# Patient Record
Sex: Female | Born: 2000 | Race: Black or African American | Hispanic: No | Marital: Single | Smoking: Never smoker
Health system: Southern US, Community
[De-identification: ages and names within clinical notes are randomized; demographics above are authoritative.]

## PROBLEM LIST (undated history)

## (undated) ENCOUNTER — Inpatient Hospital Stay (HOSPITAL_COMMUNITY): Payer: Self-pay

## (undated) DIAGNOSIS — O24419 Gestational diabetes mellitus in pregnancy, unspecified control: Secondary | ICD-10-CM

## (undated) DIAGNOSIS — D649 Anemia, unspecified: Secondary | ICD-10-CM

## (undated) DIAGNOSIS — F329 Major depressive disorder, single episode, unspecified: Secondary | ICD-10-CM

## (undated) DIAGNOSIS — E119 Type 2 diabetes mellitus without complications: Secondary | ICD-10-CM

## (undated) DIAGNOSIS — Z789 Other specified health status: Secondary | ICD-10-CM

## (undated) DIAGNOSIS — F32A Depression, unspecified: Secondary | ICD-10-CM

## (undated) HISTORY — PX: NO PAST SURGERIES: SHX2092

---
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
## 2020-01-30 ENCOUNTER — Telehealth (INDEPENDENT_AMBULATORY_CARE_PROVIDER_SITE_OTHER): Payer: Medicaid Other | Admitting: Obstetrics and Gynecology

## 2020-01-30 ENCOUNTER — Encounter: Payer: Self-pay | Admitting: Obstetrics and Gynecology

## 2020-01-30 NOTE — Progress Notes (Signed)
I connected with@ on 01/30/20 at 10:50 AM EDT by: Mychart video and verified that I am speaking with the correct person using two identifiers.  Patient is located at [place] and provider is located at [employer] for [employer] at [place] .     The purpose of this virtual visit is to provide medical care while limiting exposure to the novel coronavirus. I discussed the limitations, risks, security and privacy concerns of performing an evaluation and management service by My Chart and the availability of in person appointments. I also discussed with the patient that there may be a patient responsible charge related to this service. By engaging in this virtual visit, you consent to the provision of healthcare.  Additionally, you authorize for your insurance to be billed for the services provided during this visit.  The patient expressed understanding and agreed to proceed.  The following staff members participated in the virtual visit:  Hollice Gong, [place], [place] Partum Visit Note Subjective:   Felicia Harmon is a 19 y.o. G43P2002 female being evaluated for postpartum followup.  She is 5 weeks postpartum following a normal spontaneous vaginal delivery at  [redacted]w[redacted]d gestational weeks.  I have fully reviewed the prenatal and intrapartum course; pregnancy complicated by short interval between pregnancies.  Postpartum course has been uncomplicated. She states, "This one is much easier than my first one." Baby is doing well. Baby is feeding by formula- Gentle Smooth.. Bleeding no bleeding. Bowel function is normal. Bladder function is normal. Patient is not sexually active. Contraception method is none. She states she got pregnant with 1 month left before needing 2nd Depo shot, so she is "not going to hump anymore." Postpartum depression screening: negative.  The following portions of the patient's history were reviewed and updated as appropriate: allergies, current medications,  past family history, past medical history, past social history, past surgical history and problem list.  Review of Systems Constitutional: negative Eyes: negative Ears, nose, mouth, throat, and face: negative Respiratory: negative Cardiovascular: negative Gastrointestinal: negative Genitourinary:negative Integument/breast: negative Hematologic/lymphatic: negative Musculoskeletal:negative Neurological: negative Behavioral/Psych: negative Endocrine: negative Allergic/Immunologic: negative   Objective:  There were no vitals filed for this visit. **Patient not at home, will take vitals once home and send via My Chart message.       Assessment:  Encounter for postpartum visit - Normal postpartum exam.  Plan:  Essential components of care per ACOG recommendations:  1.  Mood and well being: Patient with negative depression screening today. Reviewed local resources for support.  - Patient does not use tobacco.  - hx of drug use? No    2. Infant care and feeding:  -Patient currently breastmilk feeding? No  -Social determinants of health (SDOH) reviewed in EPIC. No concerns  3. Sexuality, contraception and birth spacing - Patient does not want a pregnancy in the next year.  Desired family size is 2 children.  - Reviewed forms of contraception in tiered fashion. Patient desired abstinence today.   - Discussed birth spacing of 18 months  4. Sleep and fatigue -Encouraged family/partner/community support of 4 hrs of uninterrupted sleep to help with mood and fatigue  5. Physical Recovery  - Discussed patients delivery and complications - Patient had no laceration, perineal healing reviewed. Patient expressed understanding - Patient has urinary incontinence? No  - Patient is safe to resume physical and sexual activity  6.  Health Maintenance - Last pap smear done n/a d/t age and was n/a with negative  HPV. Mammogram n/a  7. Chronic Disease - PCP follow up  10 minutes of  non-face-to-face time spent with the patient. There was 5 minutes of chart review time spent prior to this encounter. Total time spent = 15 minutes.    Raelyn Mora, CNM Center for [employer], [facility]

## 2020-02-04 ENCOUNTER — Encounter: Payer: Self-pay | Admitting: Obstetrics and Gynecology

## 2020-04-19 ENCOUNTER — Other Ambulatory Visit: Payer: Self-pay

## 2020-04-19 ENCOUNTER — Inpatient Hospital Stay (HOSPITAL_COMMUNITY): Payer: Medicaid Other

## 2020-04-19 ENCOUNTER — Inpatient Hospital Stay (HOSPITAL_COMMUNITY)
Admission: EM | Admit: 2020-04-19 | Discharge: 2020-04-19 | Disposition: A | Discharge status: Home / Self Care | Payer: Medicaid Other | Attending: Obstetrics and Gynecology | Admitting: Obstetrics and Gynecology

## 2020-04-19 ENCOUNTER — Encounter (HOSPITAL_COMMUNITY): Payer: Self-pay | Admitting: Emergency Medicine

## 2020-04-19 DIAGNOSIS — Z3491 Encounter for supervision of normal pregnancy, unspecified, first trimester: Secondary | ICD-10-CM

## 2020-04-19 DIAGNOSIS — O26891 Other specified pregnancy related conditions, first trimester: Secondary | ICD-10-CM | POA: Diagnosis present

## 2020-04-19 DIAGNOSIS — J069 Acute upper respiratory infection, unspecified: Secondary | ICD-10-CM | POA: Diagnosis not present

## 2020-04-19 DIAGNOSIS — O209 Hemorrhage in early pregnancy, unspecified: Secondary | ICD-10-CM | POA: Insufficient documentation

## 2020-04-19 DIAGNOSIS — R1030 Lower abdominal pain, unspecified: Secondary | ICD-10-CM | POA: Diagnosis not present

## 2020-04-19 DIAGNOSIS — O99891 Other specified diseases and conditions complicating pregnancy: Secondary | ICD-10-CM | POA: Diagnosis not present

## 2020-04-19 DIAGNOSIS — O99511 Diseases of the respiratory system complicating pregnancy, first trimester: Secondary | ICD-10-CM | POA: Insufficient documentation

## 2020-04-19 DIAGNOSIS — R1010 Upper abdominal pain, unspecified: Secondary | ICD-10-CM | POA: Insufficient documentation

## 2020-04-19 DIAGNOSIS — R110 Nausea: Secondary | ICD-10-CM | POA: Insufficient documentation

## 2020-04-19 DIAGNOSIS — Z3687 Encounter for antenatal screening for uncertain dates: Secondary | ICD-10-CM | POA: Insufficient documentation

## 2020-04-19 DIAGNOSIS — Z3A01 Less than 8 weeks gestation of pregnancy: Secondary | ICD-10-CM | POA: Insufficient documentation

## 2020-04-19 DIAGNOSIS — Z20822 Contact with and (suspected) exposure to covid-19: Secondary | ICD-10-CM | POA: Diagnosis not present

## 2020-04-19 DIAGNOSIS — Z3A10 10 weeks gestation of pregnancy: Secondary | ICD-10-CM

## 2020-04-19 LAB — URINALYSIS, ROUTINE W REFLEX MICROSCOPIC
Bilirubin Urine: NEGATIVE
Glucose, UA: NEGATIVE mg/dL
Hgb urine dipstick: NEGATIVE
Ketones, ur: NEGATIVE mg/dL
Nitrite: NEGATIVE
Protein, ur: NEGATIVE mg/dL
Specific Gravity, Urine: 1.02 (ref 1.005–1.030)
pH: 7 (ref 5.0–8.0)

## 2020-04-19 LAB — COMPREHENSIVE METABOLIC PANEL
ALT: 21 U/L (ref 0–44)
AST: 19 U/L (ref 15–41)
Albumin: 3.8 g/dL (ref 3.5–5.0)
Alkaline Phosphatase: 72 U/L (ref 38–126)
Anion gap: 10 (ref 5–15)
BUN: <5 mg/dL — ABNORMAL LOW (ref 6–20)
CO2: 24 mmol/L (ref 22–32)
Calcium: 9.3 mg/dL (ref 8.9–10.3)
Chloride: 104 mmol/L (ref 98–111)
Creatinine, Ser: 0.63 mg/dL (ref 0.44–1.00)
GFR calc Af Amer: >60 mL/min (ref >60)
GFR calc non Af Amer: >60 mL/min (ref >60)
Glucose, Bld: 94 mg/dL (ref 70–99)
Potassium: 3.7 mmol/L (ref 3.5–5.1)
Sodium: 138 mmol/L (ref 135–145)
Total Bilirubin: 0.8 mg/dL (ref 0.3–1.2)
Total Protein: 7.8 g/dL (ref 6.5–8.1)

## 2020-04-19 LAB — URINALYSIS, MICROSCOPIC (REFLEX)

## 2020-04-19 LAB — I-STAT BETA HCG BLOOD, ED (MC, WL, AP ONLY): I-stat hCG, quantitative: >2000 m[IU]/mL — ABNORMAL HIGH (ref <5)

## 2020-04-19 LAB — WET PREP, GENITAL
Sperm: NONE SEEN
Trich, Wet Prep: NONE SEEN
Yeast Wet Prep HPF POC: NONE SEEN

## 2020-04-19 LAB — HCG, QUANTITATIVE, PREGNANCY: hCG, Beta Chain, Quant, S: 88252 m[IU]/mL — ABNORMAL HIGH (ref <5)

## 2020-04-19 LAB — CBC
HCT: 37 % (ref 36.0–46.0)
Hemoglobin: 11.1 g/dL — ABNORMAL LOW (ref 12.0–15.0)
MCH: 24.2 pg — ABNORMAL LOW (ref 26.0–34.0)
MCHC: 30 g/dL (ref 30.0–36.0)
MCV: 80.6 fL (ref 80.0–100.0)
Platelets: 302 10*3/uL (ref 150–400)
RBC: 4.59 MIL/uL (ref 3.87–5.11)
RDW: 14.6 % (ref 11.5–15.5)
WBC: 3.9 10*3/uL — ABNORMAL LOW (ref 4.0–10.5)
nRBC: 0 % (ref 0.0–0.2)

## 2020-04-19 LAB — LIPASE, BLOOD: Lipase: 33 U/L (ref 11–51)

## 2020-04-19 LAB — SARS CORONAVIRUS 2 BY RT PCR (HOSPITAL ORDER, PERFORMED IN ~~LOC~~ HOSPITAL LAB): SARS Coronavirus 2: NEGATIVE

## 2020-04-19 MED ORDER — PROMETHAZINE HCL 25 MG PO TABS
12.5000 mg | ORAL_TABLET | Freq: Once | ORAL | Status: AC
  Administered 2020-04-19: 12.5 mg via ORAL
  Filled 2020-04-19: qty 1

## 2020-04-19 MED ORDER — PROMETHAZINE HCL 12.5 MG PO TABS
12.5000 mg | ORAL_TABLET | Freq: Four times a day (QID) | ORAL | 2 refills | Status: DC | PRN
Start: 2020-04-19 — End: 2020-11-24

## 2020-04-19 NOTE — Discharge Instructions (Signed)
Center for [facility] Healthcare Prenatal Care Providers          Center for [facility] Healthcare locations:  Hours may vary. Please call for an appointment  Center for [facility] Healthcare @ [facility]  [address] [place] [phone]  Center for [facility] @ [facility]  [address] [place] [phone]  Center for [facility] Healthcare @ [facility]   [address] [place]  [phone]  Center For [facility] Healthcare @ [facility]       [address], [place], [place] [phone]            Center for [facility] @ [place]     [address], [place], [place] [phone]          Center for [facility] @ [facility]   [address], [place], [place] [phone]     Center for [facility] Healthcare @ [facility]  [address] , [place], [place]  [phone]  afe Medications in Pregnancy   Acne: Benzoyl Peroxide Salicylic Acid  Backache/Headache: Tylenol: 2 regular strength every 4 hours OR              2 Extra strength every 6 hours  Colds/Coughs/Allergies: Benadryl (alcohol free) 25 mg every 6 hours as needed Breath right strips Claritin Cepacol throat lozenges Chloraseptic throat spray Cold-Eeze- up to three times per day Cough drops, alcohol free Flonase (by prescription only) Guaifenesin Mucinex Robitussin DM (plain only, alcohol free) Saline nasal spray/drops Sudafed (pseudoephedrine) & Actifed ** use only after [redacted] weeks gestation and if you do not have high blood pressure Tylenol Vicks Vaporub Zinc lozenges Zyrtec   Constipation: Colace Ducolax suppositories Fleet enema Glycerin suppositories Metamucil Milk of magnesia Miralax Senokot Smooth move tea  Diarrhea: Kaopectate Imodium A-D  *NO pepto Bismol  Hemorrhoids: Anusol Anusol HC Preparation H Tucks  Indigestion: Tums Maalox Mylanta Zantac  Pepcid  Insomnia: Benadryl  (alcohol free) 25mg  every 6 hours as needed Tylenol PM Unisom, no Gelcaps  Leg Cramps: Tums MagGel  Nausea/Vomiting:  Bonine Dramamine Emetrol Ginger extract Sea bands Meclizine  Nausea medication to take during pregnancy:  Unisom (doxylamine succinate 25 mg tablets) Take one tablet daily at bedtime. If symptoms are not adequately controlled, the dose can be increased to a maximum recommended dose of two tablets daily (1/2 tablet in the morning, 1/2 tablet mid-afternoon and one at bedtime). Vitamin B6 100mg  tablets. Take one tablet twice a day (up to 200 mg per day).  Skin Rashes: Aveeno products Benadryl cream or 25mg  every 6 hours as needed Calamine Lotion 1% cortisone cream  Yeast infection: Gyne-lotrimin 7 Monistat 7   **If taking multiple medications, please check labels to avoid duplicating the same active ingredients **take medication as directed on the label ** Do not exceed 4000 mg of tylenol in 24 hours **Do not take medications that contain aspirin or ibuprofen

## 2020-04-19 NOTE — ED Triage Notes (Signed)
Patient states that she all of a sudden started having nasal congestion, headache.  Denies any fever or loss of taste or smell.  Patient also states she has had some nausea, no vomiting, intermittent abdominal pain.

## 2020-04-19 NOTE — MAU Note (Signed)
Felicia Harmon is a 19 y.o. here in MAU reporting: states she got sick out of nowhere 2 nights ago, states her nose was running and shes been having bad headaches. Has been having nausea. 2 days ago saw some bleeding when she wiped but none since. Having lower abdominal pain.  LMP: unsure, states she had a period at the end of June into July but is unsure about dates  Onset of complaint: ongoing  Pain score: 4/10  Vitals:   04/19/20 0438 04/19/20 0738  BP: 118/63 114/65  Pulse: 85 80  Resp: 18 16  Temp: 99.2 F (37.3 C) 100 F (37.8 C)  SpO2: 100% 100%    Lab orders placed from triage: none

## 2020-04-19 NOTE — MAU Provider Note (Signed)
Chief Complaint: Nasal Congestion and Abdominal Pain   First Provider Initiated Contact with Patient 04/19/20 0906      SUBJECTIVE HPI: Felicia Harmon is a 19 y.o. G3P2002 at 73104w3d by unsure LMP who presents to maternity admissions reporting nausea, runny nose, headache, lower abdominal cramping, and light vaginal bleeding x 2 days. She is having trouble at work with the nausea and headaches, then noticed the cramping and light bleeding.  The last time she saw bleeding was yesterday. She denies any sick contacts or fever/chills.   Location: lower abdomen Quality: cramping Severity: 4/10 on pain scale Duration: 2 days Timing: intermittent Modifying factors: none Associated signs and symptoms: nausea, vaginal bleeding  HPI  Past Medical History:  Diagnosis Date  . Depression    Past Surgical History:  Procedure Laterality Date  . NO PAST SURGERIES     Social History   Socioeconomic History  . Marital status: Single    Spouse name: Not on file  . Number of children: 1  . Years of education: Not on file  . Highest education level: 9th grade  Occupational History  . Occupation: unemployed  Tobacco Use  . Smoking status: Never Smoker  . Smokeless tobacco: Never Used  Vaping Use  . Vaping Use: Never used  Substance and Sexual Activity  . Alcohol use: No  . Drug use: No  . Sexual activity: Not Currently    Birth control/protection: None  Other Topics Concern  . Not on file  Social History Narrative  . Not on file   Social Determinants of Health   Financial Resource Strain: Low Risk   . Difficulty of Paying Living Expenses: Not hard at all  Food Insecurity: No Food Insecurity  . Worried About Programme researcher, broadcasting/film/videounning Out of Food in the Last Year: Never true  . Ran Out of Food in the Last Year: Never true  Transportation Needs: No Transportation Needs  . Lack of Transportation (Medical): No  . Lack of Transportation (Non-Medical): No  Physical Activity: Inactive  . Days of  Exercise per Week: 0 days  . Minutes of Exercise per Session: 0 min  Stress: No Stress Concern Present  . Feeling of Stress : Not at all  Social Connections: Unknown  . Frequency of Communication with Friends and Family: Not on file  . Frequency of Social Gatherings with Friends and Family: Not on file  . Attends Religious Services: Never  . Active Member of Clubs or Organizations: No  . Attends BankerClub or Organization Meetings: Never  . Marital Status: Never married  Intimate Partner Violence: Not At Risk  . Fear of Current or Ex-Partner: No  . Emotionally Abused: No  . Physically Abused: No  . Sexually Abused: No   No current facility-administered medications on file prior to encounter.   Current Outpatient Medications on File Prior to Encounter  Medication Sig Dispense Refill  . acetaminophen (TYLENOL) 325 MG tablet Take 2 tablets (650 mg total) by mouth every 4 (four) hours as needed (for pain scale < 4). (Patient not taking: Reported on 01/30/2020) 30 tablet 0  . ibuprofen (ADVIL) 600 MG tablet Take 1 tablet (600 mg total) by mouth every 6 (six) hours. (Patient not taking: Reported on 01/30/2020) 30 tablet 0  . norethindrone (MICRONOR) 0.35 MG tablet Take 1 tablet (0.35 mg total) by mouth daily. Begin at 4 weeks postpartum (Patient not taking: Reported on 01/30/2020) 1 Package 11  . Prenatal Vit-Fe Phos-FA-Omega (VITAFOL GUMMIES) 3.33-0.333-34.8 MG CHEW Chew 3 each by  mouth daily. (Patient not taking: Reported on 12/19/2019) 90 tablet 12   No Known Allergies  ROS:  Review of Systems  Constitutional: Negative for chills, fatigue and fever.  HENT: Positive for rhinorrhea.   Respiratory: Negative for shortness of breath.   Cardiovascular: Negative for chest pain.  Gastrointestinal: Positive for abdominal pain.  Genitourinary: Positive for vaginal bleeding. Negative for difficulty urinating, dysuria, flank pain, pelvic pain, vaginal discharge and vaginal pain.  Neurological: Positive  for headaches. Negative for dizziness.  Psychiatric/Behavioral: Negative.      I have reviewed patient's Past Medical Hx, Surgical Hx, Family Hx, Social Hx, medications and allergies.   Physical Exam   Patient Vitals for the past 24 hrs:  BP Temp Temp src Pulse Resp SpO2 Height Weight  04/19/20 0738 114/65 100 F (37.8 C) Oral 80 16 100 % 5' 8.5" (1.74 m) 107.3 kg  04/19/20 0438 118/63 99.2 F (37.3 C) Oral 85 18 100 % -- --  04/19/20 0346 (!) 125/111 99 F (37.2 C) Oral 93 16 99 % -- --   Constitutional: Well-developed, well-nourished female in no acute distress.  Cardiovascular: normal rate Respiratory: normal effort GI: Abd soft, non-tender. Pos BS x 4 MS: Extremities nontender, no edema, normal ROM Neurologic: Alert and oriented x 4.  GU: Neg CVAT.  PELVIC EXAM: Wet prep/GC collected by blind swab Bimanual exam without tenderness, uterus nonenlarged   LAB RESULTS Results for orders placed or performed during the hospital encounter of 04/19/20 (from the past 24 hour(s))  Urinalysis, Routine w reflex microscopic Urine, Clean Catch     Status: Abnormal   Collection Time: 04/19/20  4:22 AM  Result Value Ref Range   Color, Urine YELLOW YELLOW   APPearance CLOUDY (A) CLEAR   Specific Gravity, Urine 1.020 1.005 - 1.030   pH 7.0 5.0 - 8.0   Glucose, UA NEGATIVE NEGATIVE mg/dL   Hgb urine dipstick NEGATIVE NEGATIVE   Bilirubin Urine NEGATIVE NEGATIVE   Ketones, ur NEGATIVE NEGATIVE mg/dL   Protein, ur NEGATIVE NEGATIVE mg/dL   Nitrite NEGATIVE NEGATIVE   Leukocytes,Ua SMALL (A) NEGATIVE  SARS Coronavirus 2 by RT PCR (hospital order, performed in [facility] hospital lab) Nasopharyngeal Nasopharyngeal Swab     Status: None   Collection Time: 04/19/20  4:22 AM   Specimen: Nasopharyngeal Swab  Result Value Ref Range   SARS Coronavirus 2 NEGATIVE NEGATIVE  Urinalysis, Microscopic (reflex)     Status: Abnormal   Collection Time: 04/19/20  4:22 AM  Result Value Ref Range    RBC / HPF 0-5 0 - 5 RBC/hpf   WBC, UA 11-20 0 - 5 WBC/hpf   Bacteria, UA RARE (A) NONE SEEN   Squamous Epithelial / LPF 21-50 0 - 5   Mucus PRESENT   Lipase, blood     Status: None   Collection Time: 04/19/20  4:36 AM  Result Value Ref Range   Lipase 33 11 - 51 U/L  Comprehensive metabolic panel     Status: Abnormal   Collection Time: 04/19/20  4:36 AM  Result Value Ref Range   Sodium 138 135 - 145 mmol/L   Potassium 3.7 3.5 - 5.1 mmol/L   Chloride 104 98 - 111 mmol/L   CO2 24 22 - 32 mmol/L   Glucose, Bld 94 70 - 99 mg/dL   BUN <5 (L) 6 - 20 mg/dL   Creatinine, Ser 9.73 0.44 - 1.00 mg/dL   Calcium 9.3 8.9 - 53.2 mg/dL   Total Protein  7.8 6.5 - 8.1 g/dL   Albumin 3.8 3.5 - 5.0 g/dL   AST 19 15 - 41 U/L   ALT 21 0 - 44 U/L   Alkaline Phosphatase 72 38 - 126 U/L   Total Bilirubin 0.8 0.3 - 1.2 mg/dL   GFR calc non Af Amer >60 >60 mL/min   GFR calc Af Amer >60 >60 mL/min   Anion gap 10 5 - 15  CBC     Status: Abnormal   Collection Time: 04/19/20  4:36 AM  Result Value Ref Range   WBC 3.9 (L) 4.0 - 10.5 K/uL   RBC 4.59 3.87 - 5.11 MIL/uL   Hemoglobin 11.1 (L) 12.0 - 15.0 g/dL   HCT 56.8 36 - 46 %   MCV 80.6 80.0 - 100.0 fL   MCH 24.2 (L) 26.0 - 34.0 pg   MCHC 30.0 30.0 - 36.0 g/dL   RDW 12.7 51.7 - 00.1 %   Platelets 302 150 - 400 K/uL   nRBC 0.0 0.0 - 0.2 %  hCG, quantitative, pregnancy     Status: Abnormal   Collection Time: 04/19/20  4:36 AM  Result Value Ref Range   hCG, Beta Chain, Quant, S 88,252 (H) <5 mIU/mL  I-Stat beta hCG blood, ED     Status: Abnormal   Collection Time: 04/19/20  6:17 AM  Result Value Ref Range   I-stat hCG, quantitative >2,000.0 (H) <5 mIU/mL   Comment 3          Wet prep, genital     Status: Abnormal   Collection Time: 04/19/20  9:30 AM   Specimen: PATH Cytology Cervicovaginal Ancillary Only  Result Value Ref Range   Yeast Wet Prep HPF POC NONE SEEN NONE SEEN   Trich, Wet Prep NONE SEEN NONE SEEN   Clue Cells Wet Prep HPF POC PRESENT  (A) NONE SEEN   WBC, Wet Prep HPF POC MANY (A) NONE SEEN   Sperm NONE SEEN     --/--/A POS, A POS Performed at [facility], [address], [place], [place] [address]  [phone])  IMAGING US OB Comp Less 14 Wks  Result Date: 04/19/2020 CLINICAL DATA:  Early pregnancy, uncertain LMP/dating, quantitative beta HCG 88,252 EXAM: OBSTETRIC <14 WK ULTRASOUND TECHNIQUE: Transabdominal ultrasound was performed for evaluation of the gestation as well as the maternal uterus and adnexal regions. COMPARISON:  None for this gestation FINDINGS: Intrauterine gestational sac: Present, single Yolk sac:  Present Embryo:  Present Cardiac Activity: Present Heart Rate: 126 bpm CRL:   6 mm   6 w 2 d                  [place] EDC: 12/11/2020 Subchorionic hemorrhage:  Probable small subchronic hemorrhage Maternal uterus/adnexae: No additional uterine abnormalities. RIGHT ovary normal size and morphology 3.2 x 1.6 x 1.6 cm. LEFT ovary normal size and morphology 2.8 x 2.2 x 2.1 cm. No free pelvic fluid or adnexal masses. IMPRESSION: Single live intrauterine gestation at 6 weeks 2 days EGA by crown-rump length. Probable small subchronic hemorrhage. Electronically Signed   By: Ulyses Southward M.D.   On: 04/19/2020 10:18    MAU Management/MDM: Orders Placed This Encounter  Procedures  . SARS Coronavirus 2 by RT PCR (hospital order, performed in [facility] hospital lab) Nasopharyngeal Nasopharyngeal Swab  . Wet prep, genital  . US OB Comp Less 14 Wks  . Lipase, blood  . Comprehensive metabolic panel  . CBC  . Urinalysis,  Routine w reflex microscopic  . Urinalysis, Microscopic (reflex)  . hCG, quantitative, pregnancy  . Diet NPO time specified  . I-Stat beta hCG blood, ED  . Discharge patient    Meds ordered this encounter  Medications  . promethazine (PHENERGAN) tablet 12.5 mg  . promethazine (PHENERGAN) 12.5 MG tablet    Sig: Take 1-2 tablets (12.5-25 mg total) by mouth every 6 (six) hours as needed for nausea  or vomiting.    Dispense:  30 tablet    Refill:  2    Order Specific Question:   Supervising Provider    Answer:   Warden Fillers [[id number]]    COVID test negative, done in ED prior to MAU arrival.  Pt h/a may be related to upper respiratory infection or to n/v and dehydration due to pregnancy symptoms.  Phenergan 12.5 mg PO given in MAU with improvement in nausea.  US shows early IUP. Reviewed results with pt.  F/U with early prenatal care. List of safe OTC meds in pregnancy given. Return to MAU as needed for emergencies.    ASSESSMENT 1. Pain of upper abdomen   2. Unsure of LMP (last menstrual period) as reason for ultrasound scan   3. Upper respiratory infection, acute   4. Normal IUP (intrauterine pregnancy) on prenatal ultrasound, first trimester   5. Vaginal bleeding in pregnancy, first trimester     PLAN Discharge home Allergies as of 04/19/2020   No Known Allergies     Medication List    STOP taking these medications   ibuprofen 600 MG tablet Commonly known as: ADVIL   norethindrone 0.35 MG tablet Commonly known as: MICRONOR     TAKE these medications   acetaminophen 325 MG tablet Commonly known as: Tylenol Take 2 tablets (650 mg total) by mouth every 4 (four) hours as needed (for pain scale < 4).   promethazine 12.5 MG tablet Commonly known as: PHENERGAN Take 1-2 tablets (12.5-25 mg total) by mouth every 6 (six) hours as needed for nausea or vomiting.   Vitafol Gummies 3.33-0.333-34.8 MG Chew Chew 3 each by mouth daily.        Sharen Counter Certified Nurse-Midwife 04/19/2020  10:44 AM

## 2020-04-19 NOTE — ED Triage Notes (Signed)
Emergency Medicine Provider OB Triage Evaluation Note  Felicia Harmon is a 19 y.o. female, G2P2002, at Unknown gestation who presents to the emergency department with complaints of waves of nausea x 1 week, no vomiting, intermittent upper abd pain, and vaginal bleeding (spotting two days ago). Also c/o nasal congestion x 1 week. LMP June.   Review of  Systems  Positive: nausea, abd pain, vaginal bleeding (resolved) Negative: dysuria, CP, SOB  Physical Exam  BP 118/63 (BP Location: Right Arm)   Pulse 85   Temp 99.2 F (37.3 C) (Oral)   Resp 18   SpO2 100%  General: Awake, no distress  HEENT: Atraumatic  Resp: Normal effort  Cardiac: Normal rate Abd: Nondistended, minimal RUQ pain  MSK: Moves all extremities without difficulty Neuro: Speech clear  Medical Decision Making  Pt evaluated for pregnancy concern and is stable for transfer to MAU. Pt is in agreement with plan for transfer.  7:00 AM Called MAU, spoke with staff who tried to transfer me to provider. No one picked up.   Discussed with MAU APP who accepts patient in transfer.  Clinical Impression   1. Pregnancy, unspecified gestational age   64. Pain of upper abdomen        Renne Crigler, PA-C 04/19/20 0710

## 2020-04-20 LAB — GC/CHLAMYDIA PROBE AMP (~~LOC~~) NOT AT ARMC
Chlamydia: NEGATIVE
Comment: NEGATIVE
Comment: NORMAL
Neisseria Gonorrhea: NEGATIVE

## 2020-07-22 ENCOUNTER — Ambulatory Visit (INDEPENDENT_AMBULATORY_CARE_PROVIDER_SITE_OTHER): Payer: Medicaid Other | Admitting: Certified Nurse Midwife

## 2020-07-22 ENCOUNTER — Encounter: Payer: Self-pay | Admitting: Certified Nurse Midwife

## 2020-07-22 ENCOUNTER — Other Ambulatory Visit: Payer: Self-pay

## 2020-07-22 ENCOUNTER — Other Ambulatory Visit (HOSPITAL_COMMUNITY)
Admission: RE | Admit: 2020-07-22 | Discharge: 2020-07-22 | Disposition: A | Discharge status: Home / Self Care | Payer: Medicaid Other | Source: Ambulatory Visit | Attending: Certified Nurse Midwife | Admitting: Certified Nurse Midwife

## 2020-07-22 VITALS — BP 120/75 | HR 105 | Temp 98.5°F | Wt 220.8 lb

## 2020-07-22 DIAGNOSIS — Z3480 Encounter for supervision of other normal pregnancy, unspecified trimester: Secondary | ICD-10-CM | POA: Diagnosis present

## 2020-07-22 DIAGNOSIS — R000 Tachycardia, unspecified: Secondary | ICD-10-CM

## 2020-07-22 DIAGNOSIS — R82998 Other abnormal findings in urine: Secondary | ICD-10-CM

## 2020-07-22 DIAGNOSIS — R8290 Unspecified abnormal findings in urine: Secondary | ICD-10-CM

## 2020-07-22 DIAGNOSIS — R809 Proteinuria, unspecified: Secondary | ICD-10-CM

## 2020-07-22 DIAGNOSIS — O99019 Anemia complicating pregnancy, unspecified trimester: Secondary | ICD-10-CM

## 2020-07-22 DIAGNOSIS — O98812 Other maternal infectious and parasitic diseases complicating pregnancy, second trimester: Secondary | ICD-10-CM

## 2020-07-22 DIAGNOSIS — D509 Iron deficiency anemia, unspecified: Secondary | ICD-10-CM

## 2020-07-22 DIAGNOSIS — Z3A19 19 weeks gestation of pregnancy: Secondary | ICD-10-CM

## 2020-07-22 DIAGNOSIS — O09899 Supervision of other high risk pregnancies, unspecified trimester: Secondary | ICD-10-CM

## 2020-07-22 DIAGNOSIS — A749 Chlamydial infection, unspecified: Secondary | ICD-10-CM

## 2020-07-22 LAB — POCT URINALYSIS DIPSTICK OB
Blood, UA: NEGATIVE
Glucose, UA: NEGATIVE
Leukocytes, UA: NEGATIVE
Nitrite, UA: NEGATIVE
Spec Grav, UA: >=1.03 — AB (ref 1.010–1.025)
Urobilinogen, UA: 4 E.U./dL — AB
pH, UA: 6.5 (ref 5.0–8.0)

## 2020-07-22 NOTE — Progress Notes (Signed)
Subjective:    Felicia Harmon is being seen today for her first obstetrical visit.  This is not a planned pregnancy. She is at [redacted]w[redacted]d gestation. Her obstetrical history is significant for short interval between pregnancy. Relationship with FOB: significant other, living together. Patient does intend to breast feed. Pregnancy history fully reviewed.  Patient reports no bleeding, no contractions, no cramping, no leaking and symptoms of anxiety (SOB, increased heart rate, feeling "hot").  Indications for ASA therapy (per uptodate) One of the following: Previous pregnancy with preeclampsia, especially early onset and with an adverse outcome No Multifetal gestation No Chronic hypertension No Type 1 or 2 diabetes mellitus No Chronic kidney disease No Autoimmune disease (antiphospholipid syndrome, systemic lupus erythematosus) No   Two or more of the following: Nulliparity No Obesity (body mass index >30 kg/m2) No Family history of preeclampsia in mother or sister No Age ?35 years No Sociodemographic characteristics (African American race, low socioeconomic level) Yes Personal risk factors (eg, previous pregnancy with low birth weight or small for gestational age infant, previous adverse pregnancy outcome [eg, stillbirth], interval >10 years between pregnancies) No   Indications for early 1 hour GTT (per uptodate)  BMI >25 (>23 in Asian women) AND one of the following  Gestational diabetes mellitus in a previous pregnancy No Glycated hemoglobin ?5.7 percent (39 mmol/mol), impaired glucose tolerance, or impaired fasting glucose on previous testing No First-degree relative with diabetes No High-risk race/ethnicity (eg, African American, Latino, Native American, [place] American, Pacific Islander) Yes History of cardiovascular disease No Hypertension or on therapy for hypertension No High-density lipoprotein cholesterol level <35 mg/dL (8.34 mmol/L) and/or a triglyceride level >250 mg/dL (1.96  mmol/L) No Polycystic ovary syndrome No Physical inactivity No Other clinical condition associated with insulin resistance (eg, severe obesity, acanthosis nigricans) No Previous birth of an infant weighing ?4000 g No Previous stillbirth of unknown cause No  Review of Systems:   Review of Systems  Constitutional: Negative for activity change.  HENT: Negative for congestion, rhinorrhea and sneezing.   Eyes: Negative for visual disturbance.  Respiratory: Positive for shortness of breath.   Cardiovascular: Positive for palpitations.  Gastrointestinal: Negative for constipation, nausea and vomiting.  Genitourinary: Negative for vaginal bleeding and vaginal discharge.  Neurological: Negative for dizziness and headaches.  Psychiatric/Behavioral: Negative for suicidal ideas.  All other systems reviewed and are negative.   Objective:     BP 120/75   Pulse (!) 105   Temp 98.5 F (36.9 C)   Wt 100.2 kg   LMP 02/06/2020 (Approximate)   BMI 33.08 kg/m  Physical Exam Vitals and nursing note reviewed.  Constitutional:      Appearance: Normal appearance.  Eyes:     Pupils: Pupils are equal, round, and reactive to light.  Cardiovascular:     Rate and Rhythm: Normal rate and regular rhythm.     Pulses: Normal pulses.     Heart sounds: Normal heart sounds.  Pulmonary:     Effort: Pulmonary effort is normal.     Breath sounds: Normal breath sounds.  Musculoskeletal:        General: Normal range of motion.     Cervical back: Normal range of motion.  Skin:    General: Skin is warm and dry.  Neurological:     Mental Status: She is alert and oriented to person, place, and time.  Psychiatric:        Mood and Affect: Mood normal.        Behavior: Behavior normal.  Thought Content: Thought content normal.        Judgment: Judgment normal.     Maternal Exam:  Abdomen: Patient reports no abdominal tenderness. Introitus: not evaluated.   Cervix: not evaluated.       Assessment:   1. [redacted] weeks gestation of pregnancy  - [place] MFM OB COMP + 14 WK; Future  2. Supervision of other normal pregnancy, antepartum  - CBC/D/Plt+RPR+Rh+ABO+Rub Ab... - AFP, Serum, Open Spina Bifida - Culture, OB Urine - Urine cytology ancillary only([place]) - Hemoglobin A1c - Genetic Screening - US MFM OB COMP + 14 WK; Future  3. Short interval between pregnancies affecting pregnancy, antepartum - Contraception counseling given - patient desires Nexplanon   4. Rapid heartbeat - Ambulatory referral to Integrated Behavioral Health   Plan:    Initial labs drawn. Prenatal vitamins. Problem list reviewed and updated. AFP3 discussed: ordered. Role of ultrasound in pregnancy discussed; fetal survey: ordered. Appt made to meet with SW for possible anxiety/depression. Patient to follow up with cardiology for palpitations. Follow up in 4 weeks. 90% of 45 min visit spent on counseling and coordination of care.     Sande Rives 07/22/2020

## 2020-07-23 ENCOUNTER — Other Ambulatory Visit: Payer: Self-pay

## 2020-07-23 ENCOUNTER — Inpatient Hospital Stay (HOSPITAL_COMMUNITY)
Admission: AD | Admit: 2020-07-23 | Discharge: 2020-07-24 | Disposition: A | Discharge status: Home / Self Care | Payer: Medicaid Other | Attending: Obstetrics & Gynecology | Admitting: Obstetrics & Gynecology

## 2020-07-23 ENCOUNTER — Encounter (HOSPITAL_COMMUNITY): Payer: Self-pay | Admitting: Obstetrics & Gynecology

## 2020-07-23 DIAGNOSIS — Z3480 Encounter for supervision of other normal pregnancy, unspecified trimester: Secondary | ICD-10-CM

## 2020-07-23 DIAGNOSIS — Z9151 Personal history of suicidal behavior: Secondary | ICD-10-CM | POA: Insufficient documentation

## 2020-07-23 DIAGNOSIS — O368120 Decreased fetal movements, second trimester, not applicable or unspecified: Secondary | ICD-10-CM | POA: Insufficient documentation

## 2020-07-23 DIAGNOSIS — Z3A19 19 weeks gestation of pregnancy: Secondary | ICD-10-CM | POA: Diagnosis not present

## 2020-07-23 DIAGNOSIS — O26892 Other specified pregnancy related conditions, second trimester: Secondary | ICD-10-CM | POA: Insufficient documentation

## 2020-07-23 DIAGNOSIS — R002 Palpitations: Secondary | ICD-10-CM | POA: Diagnosis not present

## 2020-07-23 DIAGNOSIS — D509 Iron deficiency anemia, unspecified: Secondary | ICD-10-CM | POA: Insufficient documentation

## 2020-07-23 DIAGNOSIS — O99891 Other specified diseases and conditions complicating pregnancy: Secondary | ICD-10-CM

## 2020-07-23 DIAGNOSIS — R809 Proteinuria, unspecified: Secondary | ICD-10-CM | POA: Insufficient documentation

## 2020-07-23 HISTORY — DX: Anemia, unspecified: D64.9

## 2020-07-23 LAB — URINALYSIS, ROUTINE W REFLEX MICROSCOPIC
Bacteria, UA: NONE SEEN
Bilirubin Urine: NEGATIVE
Glucose, UA: NEGATIVE mg/dL
Hgb urine dipstick: NEGATIVE
Ketones, ur: NEGATIVE mg/dL
Nitrite: NEGATIVE
Protein, ur: NEGATIVE mg/dL
Specific Gravity, Urine: 1.017 (ref 1.005–1.030)
pH: 7 (ref 5.0–8.0)

## 2020-07-23 LAB — CBC/D/PLT+RPR+RH+ABO+RUB AB...
Antibody Screen: NEGATIVE
Basophils Absolute: 0 10*3/uL (ref 0.0–0.2)
Basos: 0 %
EOS (ABSOLUTE): 0.1 10*3/uL (ref 0.0–0.4)
Eos: 1 %
HCV Ab: <0.1 s/co ratio (ref 0.0–0.9)
HIV Screen 4th Generation wRfx: NONREACTIVE
Hematocrit: 29.5 % — ABNORMAL LOW (ref 34.0–46.6)
Hemoglobin: 9.6 g/dL — ABNORMAL LOW (ref 11.1–15.9)
Hepatitis B Surface Ag: NEGATIVE
Immature Grans (Abs): 0 10*3/uL (ref 0.0–0.1)
Immature Granulocytes: 0 %
Lymphocytes Absolute: 1.5 10*3/uL (ref 0.7–3.1)
Lymphs: 21 %
MCH: 25.7 pg — ABNORMAL LOW (ref 26.6–33.0)
MCHC: 32.5 g/dL (ref 31.5–35.7)
MCV: 79 fL (ref 79–97)
Monocytes Absolute: 0.7 10*3/uL (ref 0.1–0.9)
Monocytes: 10 %
Neutrophils Absolute: 4.7 10*3/uL (ref 1.4–7.0)
Neutrophils: 68 %
Platelets: 267 10*3/uL (ref 150–450)
RBC: 3.73 x10E6/uL — ABNORMAL LOW (ref 3.77–5.28)
RDW: 14 % (ref 11.7–15.4)
RPR Ser Ql: NONREACTIVE
Rh Factor: POSITIVE
Rubella Antibodies, IGG: 3.63 index (ref >0.99)
WBC: 6.9 10*3/uL (ref 3.4–10.8)

## 2020-07-23 LAB — PROTEIN / CREATININE RATIO, URINE
Creatinine, Urine: 479.4 mg/dL
Protein, Ur: 67.6 mg/dL
Protein/Creat Ratio: 141 mg/g creat (ref 0–200)

## 2020-07-23 LAB — HCV INTERPRETATION

## 2020-07-23 MED ORDER — FERROUS SULFATE 325 (65 FE) MG PO TABS: 325.0000 mg | ORAL_TABLET | ORAL | 1 refills | Status: DC

## 2020-07-23 NOTE — Addendum Note (Signed)
Addended by: Sharyon Cable on: 07/23/2020 10:03 AM   Modules accepted: Orders

## 2020-07-23 NOTE — Discharge Instructions (Signed)

## 2020-07-23 NOTE — MAU Provider Note (Signed)
Chief Complaint:  No chief complaint on file.   Event Date/Time   First Provider Initiated Contact with Patient 07/23/20 2023     HPI: Felicia Harmon is a 19 y.o. G3P2002 at 5w5dwho presents to maternity admissions reporting decreased fetal movement and intermittent heart racing episodes.  States really just wants to check on the baby to see if it is alright. . She denies LOF, vaginal bleeding, vaginal itching/burning, urinary symptoms, h/a, dizziness, n/v, diarrhea, constipation or fever/chills.    Other This is a new problem. The current episode started today. The problem has been unchanged. Pertinent negatives include no abdominal pain, chest pain, chills, fever, headaches, myalgias, nausea, urinary symptoms or vertigo. Nothing aggravates the symptoms. She has tried nothing for the symptoms.    RN Note: I had doctor's appt yesterday and heard FHTs yesterday. Have not felt baby move much. I had suicide attempt last month where I took 17 pills and want to be sure baby is ok. States is ok tonight but wants to be sure baby is ok. Has u/s Jan 12th but is just concerned. States heart races sometimes and feels like she may pass out. States her CNM Suzette Battiest is aware and states may be dehydrated and to drink more  Past Medical History: Past Medical History:  Diagnosis Date  . Depression   . Labor and delivery, indication for care 12/19/2019  . Supervision of other normal pregnancy, antepartum 04/30/2019    Nursing Staff Provider Office Location  [facility] Dating  Early Ultrasound Language  English Anatomy US  09/19/19 Normal Flu Vaccine  Declined Genetic Screen  NIPS:  Low risk Girl  TDaP vaccine   10/08/19 Hgb A1C or  GTT Early 5.3 Third trimester 87-88-89 Rhogam  N/A   LAB RESULTS  Feeding Plan Formula Blood Type A/Positive/-- (11/06 1102)  Contraception Undecided Antibody Negative (11/06 1102) Ci    Past obstetric history: OB History  Gravida Para Term Preterm AB Living  3 2 2     2   SAB IAB  Ectopic Multiple Live Births        0 2    # Outcome Date GA Lbr Len/2nd Weight Sex Delivery Anes PTL Lv  3 Current           2 Term 12/19/19 [redacted]w[redacted]d 08:55 / 01:05 3487 g F Vag-Spont EPI  LIV  1 Term 05/04/18 [redacted]w[redacted]d 08:01 / 00:18 3496 g F Vag-Spont EPI  LIV    Past Surgical History: Past Surgical History:  Procedure Laterality Date  . NO PAST SURGERIES      Family History: Family History  Problem Relation Age of Onset  . Healthy Mother   . Healthy Father     Social History: Social History   Tobacco Use  . Smoking status: Never Smoker  . Smokeless tobacco: Never Used  Vaping Use  . Vaping Use: Never used  Substance Use Topics  . Alcohol use: No  . Drug use: No    Allergies: No Known Allergies  Meds:  Medications Prior to Admission  Medication Sig Dispense Refill Last Dose  . acetaminophen (TYLENOL) 325 MG tablet Take 2 tablets (650 mg total) by mouth every 4 (four) hours as needed (for pain scale < 4). (Patient not taking: No sig reported) 30 tablet 0   . ferrous sulfate (FERROUSUL) 325 (65 FE) MG tablet Take 1 tablet (325 mg total) by mouth every other day. 60 tablet 1   . Prenatal Vit-Fe Phos-FA-Omega (VITAFOL GUMMIES) 3.33-0.333-34.8 MG CHEW Chew  3 each by mouth daily. (Patient not taking: No sig reported) 90 tablet 12   . promethazine (PHENERGAN) 12.5 MG tablet Take 1-2 tablets (12.5-25 mg total) by mouth every 6 (six) hours as needed for nausea or vomiting. (Patient not taking: Reported on 07/22/2020) 30 tablet 2     I have reviewed patient's Past Medical Hx, Surgical Hx, Family Hx, Social Hx, medications and allergies.   ROS:  Review of Systems  Constitutional: Negative for chills and fever.  Cardiovascular: Negative for chest pain.  Gastrointestinal: Negative for abdominal pain and nausea.  Musculoskeletal: Negative for myalgias.  Neurological: Negative for vertigo and headaches.   Other systems negative  Physical Exam   Vitals:   07/23/20 2123 07/23/20  2126  BP:  111/66  Pulse:  96  Resp: 18   Temp: 97.8 F (36.6 C)   Weight: 100.2 kg   Height: 5\' 9"  (1.753 m)     Constitutional: Well-developed, well-nourished female in no acute distress.  Cardiovascular: normal rate and rhythm Respiratory: normal effort, clear to auscultation bilaterally GI: Abd soft, non-tender, gravid appropriate for gestational age.   No rebound or guarding. MS: Extremities nontender, no edema, normal ROM Neurologic: Alert and oriented x 4.  GU: Neg CVAT.  PELVIC EXAM:  deferred  FHT:   160  Labs: Results for orders placed or performed during the hospital encounter of 07/23/20 (from the past 24 hour(s))  Urinalysis, Routine w reflex microscopic Urine, Clean Catch     Status: Abnormal   Collection Time: 07/23/20  9:35 PM  Result Value Ref Range   Color, Urine AMBER (A) YELLOW   APPearance HAZY (A) CLEAR   Specific Gravity, Urine 1.017 1.005 - 1.030   pH 7.0 5.0 - 8.0   Glucose, UA NEGATIVE NEGATIVE mg/dL   Hgb urine dipstick NEGATIVE NEGATIVE   Bilirubin Urine NEGATIVE NEGATIVE   Ketones, ur NEGATIVE NEGATIVE mg/dL   Protein, ur NEGATIVE NEGATIVE mg/dL   Nitrite NEGATIVE NEGATIVE   Leukocytes,Ua LARGE (A) NEGATIVE   RBC / HPF 0-5 0 - 5 RBC/hpf   WBC, UA 11-20 0 - 5 WBC/hpf   Bacteria, UA NONE SEEN NONE SEEN   Squamous Epithelial / LPF 11-20 0 - 5   Mucus PRESENT     A/Positive/-- (12/15 1417)  Imaging:  Bedside 11-22-2002 done for patient reassurance Active single fetus Patient saw fetus moving and did not feel it Anterior placenta Discussed GA is one where FM will not always be felt EKG NSR  MAU Course/MDM: I have ordered labs and reviewed results.  NST reviewed  Treatments in MAU included EKG, [place].    Assessment: Single IUP at [redacted]w[redacted]d Decreased perception of fetal movement Recent suicide attempt Worried but well Palpitations, normal sinus rhythm  Plan: Discharge home Reassured about fetal status Follow up in Office for prenatal  visits and recheck FHR  Encouraged to return if she develops worsening of symptoms, increase in pain, fever, or other concerning symptoms.   Pt stable at time of discharge.  [redacted]w[redacted]d CNM, MSN Certified Nurse-Midwife 07/23/2020 8:24 PM

## 2020-07-23 NOTE — MAU Note (Addendum)
I had doctor's appt yesterday and heard FHTs yesterday. Have not felt baby move much. I had suicide attempt last month where I took 17 pills and want to be sure baby is ok. States is ok tonight but wants to be sure baby is ok. Has u/s Jan 12th but is just concerned. States heart races sometimes and feels like she may pass out. States her CNM Suzette Battiest is aware and states may be dehydrated and to drink more

## 2020-07-23 NOTE — MAU Note (Signed)
Patient report to triage for decrease fetal movement for the past 24 hrs. Patient reports intermittent pain on sides of belly at a pain score of a 6/10. Patient denies any bleeding, cramping, or leaking of fluid. Triage assessment completed. MAU provider to be notified

## 2020-07-24 DIAGNOSIS — T1491XA Suicide attempt, initial encounter: Secondary | ICD-10-CM | POA: Insufficient documentation

## 2020-07-24 LAB — URINE CYTOLOGY ANCILLARY ONLY
Chlamydia: POSITIVE — AB
Comment: NEGATIVE
Comment: NEGATIVE
Comment: NORMAL
Neisseria Gonorrhea: NEGATIVE
Trichomonas: NEGATIVE

## 2020-07-24 LAB — AFP, SERUM, OPEN SPINA BIFIDA
AFP MoM: 1.24
AFP Value: 54.2 ng/mL
Gest. Age on Collection Date: 19 weeks
Maternal Age At EDD: 19.9 yr
OSBR Risk 1 IN: 10000
Test Results:: NEGATIVE
Weight: 220 [lb_av]

## 2020-07-24 LAB — HEMOGLOBIN A1C
Est. average glucose Bld gHb Est-mCnc: 103 mg/dL
Hgb A1c MFr Bld: 5.2 % (ref 4.8–5.6)

## 2020-07-25 LAB — URINE CULTURE, OB REFLEX

## 2020-07-25 LAB — CULTURE, OB URINE

## 2020-07-28 DIAGNOSIS — O98812 Other maternal infectious and parasitic diseases complicating pregnancy, second trimester: Secondary | ICD-10-CM | POA: Insufficient documentation

## 2020-07-28 DIAGNOSIS — A749 Chlamydial infection, unspecified: Secondary | ICD-10-CM | POA: Insufficient documentation

## 2020-07-28 MED ORDER — AZITHROMYCIN 500 MG PO TABS: 1000.0000 mg | ORAL_TABLET | Freq: Once | ORAL | 0 refills | Status: DC

## 2020-07-28 NOTE — Addendum Note (Signed)
Addended by: Sharyon Cable on: 07/28/2020 04:07 AM   Modules accepted: Orders

## 2020-08-08 NOTE — L&D Delivery Note (Signed)
Patient: Felicia Harmon MRN: [id number]  GBS status: GBS neg, IAP given: None   Patient is a 20 y.o. now G3P3 s/p NSVD at 103w4d, who was admitted for IOL for polyhydramnios and poorly controlled A1GDM. AROM 0h 24m prior to delivery with clear fluid.    Delivery Note At 2:02 PM a viable female was delivered via Vaginal, Spontaneous (Presentation: Left Occiput Anterior).  APGAR: 9, 9; weight 7 lb 7.6 oz (3391 g).   Placenta status: Spontaneous, Intact.  Cord: 3 vessels with the following complications: loose nuchal cord x2.  Cord pH: N/A  Anesthesia: Epidural Episiotomy: None Lacerations: None Suture Repair: N/A Est. Blood Loss (mL): 120  Head delivered LOA. Compound hand noted. Shoulder and body delivered in usual fashion with loose nuchal cord x2, reduced after delivery. Infant with spontaneous cry, placed on mother's abdomen, dried and bulb suctioned. Cord clamped x 2 after 1-minute delay, and cut by this provider. Cord blood drawn. Placenta delivered spontaneously with gentle cord traction. Fundus firm with massage and Pitocin. Perineum inspected and found to have no lacerations.  Mom to postpartum.  Baby to Couplet care / Skin to Skin.  De Hollingshead 11/24/2020, 6:25 PM

## 2020-08-14 ENCOUNTER — Other Ambulatory Visit: Payer: Self-pay | Admitting: *Deleted

## 2020-08-14 DIAGNOSIS — D509 Iron deficiency anemia, unspecified: Secondary | ICD-10-CM

## 2020-08-14 DIAGNOSIS — A749 Chlamydial infection, unspecified: Secondary | ICD-10-CM

## 2020-08-14 MED ORDER — FERROUS SULFATE 325 (65 FE) MG PO TABS: 325.0000 mg | ORAL_TABLET | ORAL | 1 refills | Status: DC

## 2020-08-14 MED ORDER — AZITHROMYCIN 500 MG PO TABS: 1000.0000 mg | ORAL_TABLET | Freq: Once | ORAL | 0 refills | Status: AC

## 2020-08-17 ENCOUNTER — Encounter (HOSPITAL_COMMUNITY): Payer: Self-pay | Admitting: Obstetrics and Gynecology

## 2020-08-17 ENCOUNTER — Inpatient Hospital Stay (HOSPITAL_COMMUNITY)
Admission: AD | Admit: 2020-08-17 | Discharge: 2020-08-18 | Disposition: A | Discharge status: Home / Self Care | Payer: Medicaid Other | Attending: Obstetrics and Gynecology | Admitting: Obstetrics and Gynecology

## 2020-08-17 ENCOUNTER — Other Ambulatory Visit: Payer: Self-pay

## 2020-08-17 DIAGNOSIS — U071 COVID-19: Secondary | ICD-10-CM | POA: Diagnosis not present

## 2020-08-17 DIAGNOSIS — O98512 Other viral diseases complicating pregnancy, second trimester: Secondary | ICD-10-CM | POA: Insufficient documentation

## 2020-08-17 DIAGNOSIS — R52 Pain, unspecified: Secondary | ICD-10-CM

## 2020-08-17 DIAGNOSIS — Z3A23 23 weeks gestation of pregnancy: Secondary | ICD-10-CM | POA: Diagnosis not present

## 2020-08-17 MED ORDER — ACETAMINOPHEN 500 MG PO TABS: 1000.0000 mg | ORAL_TABLET | Freq: Four times a day (QID) | ORAL | 0 refills | Status: AC | PRN

## 2020-08-17 MED ORDER — ACETAMINOPHEN 500 MG PO TABS
1000.0000 mg | ORAL_TABLET | Freq: Four times a day (QID) | ORAL | Status: DC | PRN
  Filled 2020-08-17: qty 2

## 2020-08-17 NOTE — MAU Provider Note (Cosign Needed)
None     Chief Complaint:  Headache and Fever   Felicia Harmon is  20 y.o. G3P2002 at [redacted]w[redacted]d presents complaining of Headache and Fever .  States that she took a nap this afternoon and woke up with body aches, HA and temp of 100.4. No known covid or flu exposures.  Has not been vaccinated against either.  Obstetrical/Gynecological History: OB History     Gravida  3   Para  2   Term  2   Preterm      AB      Living  2      SAB      IAB      Ectopic      Multiple  0   Live Births  2          Past Medical History: Past Medical History:  Diagnosis Date   Anemia    Depression    Labor and delivery, indication for care 12/19/2019   Supervision of other normal pregnancy, antepartum 04/30/2019    Nursing Staff Provider Office Location  [facility] Dating  Early Ultrasound Language  English Anatomy US  09/19/19 Normal Flu Vaccine  Declined Genetic Screen  NIPS:  Low risk Girl  TDaP vaccine   10/08/19 Hgb A1C or  GTT Early 5.3 Third trimester 87-88-89 Rhogam  N/A   LAB RESULTS  Feeding Plan Formula Blood Type A/Positive/-- (11/06 1102)  Contraception Undecided Antibody Negative (11/06 1102) Ci    Past Surgical History: Past Surgical History:  Procedure Laterality Date   NO PAST SURGERIES      Family History: Family History  Problem Relation Age of Onset   Healthy Mother    Healthy Father     Social History: Social History   Tobacco Use   Smoking status: Never Smoker   Smokeless tobacco: Never Used  Building services engineer Use: Never used  Substance Use Topics   Alcohol use: No   Drug use: No    Allergies: No Known Allergies  Meds:  Medications Prior to Admission  Medication Sig Dispense Refill Last Dose   acetaminophen (TYLENOL) 325 MG tablet Take 2 tablets (650 mg total) by mouth every 4 (four) hours as needed (for pain scale < 4). 30 tablet 0    chlorhexidine (PERIDEX) 0.12 % solution SMARTSIG:15 Milliliter(s) By Mouth Morning-Evening      ferrous  sulfate (FERROUSUL) 325 (65 FE) MG tablet Take 1 tablet (325 mg total) by mouth every other day. 60 tablet 1    Prenatal Vit-Fe Phos-FA-Omega (VITAFOL GUMMIES) 3.33-0.333-34.8 MG CHEW Chew 3 each by mouth daily. (Patient not taking: No sig reported) 90 tablet 12    promethazine (PHENERGAN) 12.5 MG tablet Take 1-2 tablets (12.5-25 mg total) by mouth every 6 (six) hours as needed for nausea or vomiting. 30 tablet 2     Review of Systems   Constitutional: Negative for fever and chills Eyes: Negative for visual disturbances Respiratory: Negative for shortness of breath, dyspnea Cardiovascular: Negative for chest pain or palpitations  Gastrointestinal: Negative for vomiting, diarrhea and constipation Genitourinary: Negative for dysuria and urgency Musculoskeletal: Negative for back pain, joint pain, myalgias.  Normal ROM  Neurological: Negative for dizziness and headaches    Physical Exam  Blood pressure 106/65, pulse (!) 109, temperature 99.9 F (37.7 C), temperature source Oral, resp. rate 20, height 5' 8.5" (1.74 m), weight 103.3 kg, last menstrual period 02/06/2020, unknown if currently breastfeeding. GENERAL: Well-developed, well-nourished female in no acute distress.  LUNGS: Normal respiratory effort HEART: Regular rate and rhythm. ABDOMEN: Soft, nontender, nondistended, gravid.  EXTREMITIES: Nontender, no edema, 2+ distal pulses. DTR's 2+ FHT:  Baseline rate 150 bpm   Variability moderate  Accelerations present   Decelerations none Contractions: Every 0 mins   Labs: No results found for this or any previous visit (from the past 24 hour(s)). Imaging Studies:  No results found.  Assessment/Plan: Quetzalli Clos is  20 y.o. G3P2002 at [redacted]w[redacted]d presents with probably viral illness.  Given APAP and resp panel collected.  Will rx Tamiflu if + influenza. Pt to quarantine until test results come back. Scarlette Calico Cresenzo-Dishmon 1/10/202210:12 PM

## 2020-08-17 NOTE — MAU Note (Signed)
PT SAYS HER LEGS ACHY- TEMP AT HOME 101.3. HAS H/A- ON/OFF-  DID NOT TAKE ANY MEDS .  FEELS NAUSEA .  PNC WITH  [facility] -

## 2020-08-18 ENCOUNTER — Encounter: Payer: Self-pay | Admitting: Advanced Practice Midwife

## 2020-08-18 ENCOUNTER — Telehealth: Payer: Self-pay

## 2020-08-18 DIAGNOSIS — U071 COVID-19: Secondary | ICD-10-CM | POA: Insufficient documentation

## 2020-08-18 LAB — RESP PANEL BY RT-PCR (FLU A&B, COVID) ARPGX2
Influenza A by PCR: NEGATIVE
Influenza B by PCR: NEGATIVE
SARS Coronavirus 2 by RT PCR: POSITIVE — AB

## 2020-08-18 NOTE — Telephone Encounter (Signed)
Patient called and notified of positive COVID test. Reviewed results of positive COVID test with patient. Discussed typical course of virus and what to expect. Informed patient that symptoms tend to worsen on days 5-8 and reviewed safe medications for symptom management. Warning signs of when to return to MAU reviewed at length including shortness of breath, chest pain and decreased fetal movement. Instructed patient to quarantine for 10 days and any office appointments will be changed to virtual or rescheduled to outside of the quarantine window. Urgent message sent to clinic.  Recommended monoclonal antibody infusion for patient due to new diagnosis of COVID with symptom onset of less than 7 days and patient is currently unvaccinated for COVID. Discussed with patient that MAB are shown to decrease length and severity of the disease and can improve overall outcomes. Reviewed limited evidence for use in pregnancy as well as evidence regarding outcomes for COVID if untreated in pregnancy. Patient desires MAB. Order placed for ambulatory referral to COVID treatment. Patient informed that if they meet criteria for MAB infusion, the team will call to notify them. Instructed patient not to call MAB center and if they receive no call, they will not receive the infusion due to limited supply. Patient verbalized understanding.   Rolm Bookbinder, CNM 08/18/20 12:06 AM

## 2020-08-19 ENCOUNTER — Ambulatory Visit: Payer: Medicaid Other | Admitting: Licensed Clinical Social Worker

## 2020-08-19 ENCOUNTER — Other Ambulatory Visit: Payer: Self-pay

## 2020-08-19 ENCOUNTER — Telehealth: Payer: Self-pay | Admitting: Licensed Clinical Social Worker

## 2020-08-19 ENCOUNTER — Ambulatory Visit: Payer: Medicaid Other | Attending: Certified Nurse Midwife

## 2020-08-19 NOTE — Telephone Encounter (Signed)
Called pt regarding scheduled appt. Pt changed to mychart due to positive covid test result from 08/18/2020 MAU visit.

## 2020-09-02 ENCOUNTER — Other Ambulatory Visit: Payer: Self-pay

## 2020-09-02 ENCOUNTER — Ambulatory Visit (INDEPENDENT_AMBULATORY_CARE_PROVIDER_SITE_OTHER): Payer: Medicaid Other

## 2020-09-02 VITALS — BP 119/67 | HR 103 | Temp 98.5°F | Wt 230.8 lb

## 2020-09-02 DIAGNOSIS — A749 Chlamydial infection, unspecified: Secondary | ICD-10-CM

## 2020-09-02 DIAGNOSIS — O98812 Other maternal infectious and parasitic diseases complicating pregnancy, second trimester: Secondary | ICD-10-CM | POA: Diagnosis not present

## 2020-09-02 DIAGNOSIS — O99019 Anemia complicating pregnancy, unspecified trimester: Secondary | ICD-10-CM

## 2020-09-02 DIAGNOSIS — D509 Iron deficiency anemia, unspecified: Secondary | ICD-10-CM

## 2020-09-02 DIAGNOSIS — Z3480 Encounter for supervision of other normal pregnancy, unspecified trimester: Secondary | ICD-10-CM | POA: Diagnosis not present

## 2020-09-02 DIAGNOSIS — F5089 Other specified eating disorder: Secondary | ICD-10-CM

## 2020-09-02 DIAGNOSIS — Z3A25 25 weeks gestation of pregnancy: Secondary | ICD-10-CM

## 2020-09-02 MED ORDER — AZITHROMYCIN 500 MG PO TABS
1000.0000 mg | ORAL_TABLET | Freq: Once | ORAL | Status: AC
Start: 2020-09-02 — End: 2020-09-02
  Administered 2020-09-02: 1000 mg via ORAL

## 2020-09-02 NOTE — Patient Instructions (Signed)
Oral Glucose Tolerance Test During Pregnancy Why am I having this test? The oral glucose tolerance test (OGTT) is done to check how your body processes blood sugar (glucose). This is one of several tests used to diagnose diabetes that develops during pregnancy (gestational diabetes mellitus). Gestational diabetes is a short-term form of diabetes that some women develop while they are pregnant. It usually occurs during the second trimester of pregnancy and goes away after delivery. Testing, or screening, for gestational diabetes usually occurs at weeks 24-28 of pregnancy. You may have the OGTT test after having a 1-hour glucose screening test if the results from that test indicate that you may have gestational diabetes. This test may also be needed if:  You have a history of gestational diabetes.  There is a history of giving birth to very large babies or of losing pregnancies (having stillbirths).  You have signs and symptoms of diabetes, such as: ? Changes in your eyesight. ? Tingling or numbness in your hands or feet. ? Changes in hunger, thirst, and urination, and these are not explained by your pregnancy. What is being tested? This test measures the amount of glucose in your blood at different times during a period of 3 hours. This shows how well your body can process glucose. What kind of sample is taken? Blood samples are required for this test. They are usually collected by inserting a needle into a blood vessel.   How do I prepare for this test?  For 3 days before your test, eat normally. Have plenty of carbohydrate-rich foods.  Follow instructions from your health care provider about: ? Eating or drinking restrictions on the day of the test. You may be asked not to eat or drink anything other than water (to fast) starting 8-10 hours before the test. ? Changing or stopping your regular medicines. Some medicines may interfere with this test. Tell a health care provider about:  All  medicines you are taking, including vitamins, herbs, eye drops, creams, and over-the-counter medicines.  Any blood disorders you have.  Any surgeries you have had.  Any medical conditions you have. What happens during the test? First, your blood glucose will be measured. This is referred to as your fasting blood glucose because you fasted before the test. Then, you will drink a glucose solution that contains a certain amount of glucose. Your blood glucose will be measured again 1, 2, and 3 hours after you drink the solution. This test takes about 3 hours to complete. You will need to stay at the testing location during this time. During the testing period:  Do not eat or drink anything other than the glucose solution.  Do not exercise.  Do not use any products that contain nicotine or tobacco, such as cigarettes, e-cigarettes, and chewing tobacco. These can affect your test results. If you need help quitting, ask your health care provider. The testing procedure may vary among health care providers and hospitals. How are the results reported? Your results will be reported as milligrams of glucose per deciliter of blood (mg/dL) or millimoles per liter (mmol/L). There is more than one source for screening and diagnosis reference values used to diagnose gestational diabetes. Your health care provider will compare your results to normal values that were established after testing a large group of people (reference values). Reference values may vary among labs and hospitals. For this test ([facility]), reference values are:  Fasting: 95 mg/dL (5.3 mmol/L).  1 hour: 180 mg/dL (10.0 mmol/L).  2 hour:   155 mg/dL (8.6 mmol/L).  3 hour: 140 mg/dL (7.8 mmol/L). What do the results mean? Results below the reference values are considered normal. If two or more of your blood glucose levels are at or above the reference values, you may be diagnosed with gestational diabetes. If only one level is  high, your health care provider may suggest repeat testing or other tests to confirm a diagnosis. Talk with your health care provider about what your results mean. Questions to ask your health care provider Ask your health care provider, or the department that is doing the test:  When will my results be ready?  How will I get my results?  What are my treatment options?  What other tests do I need?  What are my next steps? Summary  The oral glucose tolerance test (OGTT) is one of several tests used to diagnose diabetes that develops during pregnancy (gestational diabetes mellitus). Gestational diabetes is a short-term form of diabetes that some women develop while they are pregnant.  You may have the OGTT test after having a 1-hour glucose screening test if the results from that test show that you may have gestational diabetes. You may also have this test if you have any symptoms or risk factors for this type of diabetes.  Talk with your health care provider about what your results mean. This information is not intended to replace advice given to you by your health care provider. Make sure you discuss any questions you have with your health care provider. Document Revised: 01/02/2020 Document Reviewed: 01/02/2020 Elsevier Patient Education  2021 Elsevier Inc.  

## 2020-09-02 NOTE — Addendum Note (Signed)
Addended by: Clovis Pu on: 09/02/2020 02:58 PM   Modules accepted: Orders

## 2020-09-02 NOTE — Progress Notes (Signed)
LOW-RISK PREGNANCY OFFICE VISIT  Patient name: Felicia Harmon MRN [id number]  Date of birth: 12-18-2000 Chief Complaint:   Routine Prenatal Visit  Subjective:   Felicia Harmon is a 20 y.o. G64P2002 female at [redacted]w[redacted]d with an Estimated Date of Delivery: 12/12/20 being seen today for ongoing management of a Low-risk pregnancy aeb has Adjustment disorder with mixed disturbance of emotions and conduct; Rapid heartbeat; Pica; Short interval between pregnancies affecting pregnancy, antepartum; Group beta Strep positive; Supervision of other normal pregnancy, antepartum; Iron deficiency anemia during pregnancy; Suicide attempt (HCC); Chlamydia infection affecting pregnancy in second trimester; and COVID-19 on their problem list.  Patient presents today without complaints.  Patient endorses fetal movement. She denies vaginal concerns including abnormal discharge, leaking of fluid, and bleeding.  Contractions: Irritability. Vag. Bleeding: None.  Movement: Present.  Reviewed past medical,surgical, social, obstetrical and family history as well as problem list, medications and allergies.  Objective   Vitals:   09/02/20 1420  BP: 119/67  Pulse: (!) 103  Temp: 98.5 F (36.9 C)  Weight: 230 lb 12.8 oz (104.7 kg)  Body mass index is 34.58 kg/m.  Total Weight Gain:30 lb 12.8 oz (14 kg)         Physical Examination:   General appearance: Well appearing, and in no distress  Mental status: Alert, oriented to person, place, and time  Skin: Warm & dry  Cardiovascular: Normal heart rate noted  Respiratory: Normal respiratory effort, no distress  Abdomen: Soft, gravid, nontender, LGA with Fundal height of Fundal Height: 30 cm  Pelvic: Cervical exam deferred           Extremities: Edema: None  Fetal Status: Fetal Heart Rate (bpm): 140  Movement: Present   No results found for this or any previous visit (from the past 24 hour(s)).  Assessment & Plan:  Low-risk pregnancy of a 20 y.o., G3P2002 at [redacted]w[redacted]d  with an Estimated Date of Delivery: 12/12/20   1. Supervision of other normal pregnancy, antepartum -Anticipatory guidance for upcoming appts. -Patient to next appt in 2-3 weeks for in-person visit. -Reviewed Glucola appt preparation including fasting the night before and morning of.   -Discussed anticipated office time of 2.5-3 hours.  -Reviewed blood draw procedures and labs which also include check of iron level.  -Discussed how results of GTT are handled including diabetic education and BS testing for abnormal results and routine care for normal results.   2. Chlamydia infection affecting pregnancy in second trimester -Patient did not take medication. -Medication given in office and patient instructed to take after eating. -Plan for TOC in 3 weeks.  -Patient not with partner anymore.  3. [redacted] weeks gestation of pregnancy -Doing well.  4. Iron deficiency anemia during pregnancy -Reviewed initial HgB of 9.6 -Not taking iron supplement -Informed that if iron level remains low will send for infusion. -Plan to recheck at 28 weeks  5. Pica -Eating cornstarch daily. -Reviewed disorder; called amylophagia. -Informed that no known correlation between iron deficiency as cause or result. -Encouraged to discontinue.      Meds: No orders of the defined types were placed in this encounter.  Labs/procedures today:  Lab Orders  No laboratory test(s) ordered today     Reviewed: Preterm labor symptoms and general obstetric precautions including but not limited to vaginal bleeding, contractions, leaking of fluid and fetal movement were reviewed in detail with the patient.  All questions were answered.  Follow-up: Return in about 3 weeks (around 09/23/2020) for LROB with GTT.  No  orders of the defined types were placed in this encounter.  Cherre Robins MSN, CNM 09/02/2020

## 2020-09-10 ENCOUNTER — Ambulatory Visit: Payer: Medicaid Other

## 2020-09-13 ENCOUNTER — Encounter (HOSPITAL_COMMUNITY): Payer: Self-pay | Admitting: Obstetrics and Gynecology

## 2020-09-13 ENCOUNTER — Inpatient Hospital Stay (HOSPITAL_COMMUNITY)
Admission: AD | Admit: 2020-09-13 | Discharge: 2020-09-13 | Disposition: A | Discharge status: Home / Self Care | Payer: Medicaid Other | Attending: Obstetrics and Gynecology | Admitting: Obstetrics and Gynecology

## 2020-09-13 ENCOUNTER — Other Ambulatory Visit: Payer: Self-pay

## 2020-09-13 DIAGNOSIS — Z3A27 27 weeks gestation of pregnancy: Secondary | ICD-10-CM | POA: Diagnosis not present

## 2020-09-13 DIAGNOSIS — R252 Cramp and spasm: Secondary | ICD-10-CM | POA: Insufficient documentation

## 2020-09-13 DIAGNOSIS — N9489 Other specified conditions associated with female genital organs and menstrual cycle: Secondary | ICD-10-CM | POA: Diagnosis not present

## 2020-09-13 DIAGNOSIS — O26892 Other specified pregnancy related conditions, second trimester: Secondary | ICD-10-CM | POA: Insufficient documentation

## 2020-09-13 DIAGNOSIS — E860 Dehydration: Secondary | ICD-10-CM | POA: Diagnosis not present

## 2020-09-13 DIAGNOSIS — Z3689 Encounter for other specified antenatal screening: Secondary | ICD-10-CM | POA: Insufficient documentation

## 2020-09-13 DIAGNOSIS — O99282 Endocrine, nutritional and metabolic diseases complicating pregnancy, second trimester: Secondary | ICD-10-CM | POA: Insufficient documentation

## 2020-09-13 DIAGNOSIS — R109 Unspecified abdominal pain: Secondary | ICD-10-CM | POA: Insufficient documentation

## 2020-09-13 LAB — URINALYSIS, ROUTINE W REFLEX MICROSCOPIC
Bilirubin Urine: NEGATIVE
Glucose, UA: NEGATIVE mg/dL
Hgb urine dipstick: NEGATIVE
Ketones, ur: 5 mg/dL — AB
Nitrite: NEGATIVE
Protein, ur: 100 mg/dL — AB
Specific Gravity, Urine: 1.029 (ref 1.005–1.030)
pH: 6 (ref 5.0–8.0)

## 2020-09-13 LAB — WET PREP, GENITAL
Clue Cells Wet Prep HPF POC: NONE SEEN
Sperm: NONE SEEN
Trich, Wet Prep: NONE SEEN
Yeast Wet Prep HPF POC: NONE SEEN

## 2020-09-13 NOTE — MAU Note (Signed)
Pt here c/o of abdominal cramping X 3days, endorses positive fm, denies bleeding or leaking of fluid. Denies complications with pregnancy. Tested positive for covid 1/10.

## 2020-09-13 NOTE — MAU Provider Note (Signed)
Event Date/Time   First Provider Initiated Contact with Patient 09/13/20 2029     S [name] is a 20 y.o. G63P2002 pregnant female at [redacted]w[redacted]d who presents to MAU today with complaint of abdominal cramping x3 days that she rates as "mild but there." She denies vaginal bleeding/discharge, loss of fluid or decreased fetal movement. She was diagnosed with Covid on 08/17/20 and is currently asymptomatic.    O BP 109/64   Pulse (!) 110   Temp 98.7 F (37.1 C)   Ht 5' 8.5" (1.74 m)   Wt 233 lb (105.7 kg)   LMP 02/06/2020 (Approximate)   SpO2 100%   BMI 34.91 kg/m  Physical Exam Vitals and nursing note reviewed. Exam conducted with a chaperone present.  Constitutional:      General: She is not in acute distress.    Appearance: She is well-developed. She is not ill-appearing.  HENT:     Mouth/Throat:     Mouth: Mucous membranes are moist.  Eyes:     Pupils: Pupils are equal, round, and reactive to light.  Cardiovascular:     Rate and Rhythm: Normal rate.  Pulmonary:     Effort: Pulmonary effort is normal.  Abdominal:     General: Bowel sounds are normal.     Palpations: Abdomen is soft.     Tenderness: There is no abdominal tenderness.     Comments: Gravid    Genitourinary:    Vagina: Vaginal discharge (thick white vaginal discharge, no bleeding noted) present.     Comments: Dilation: Closed Effacement (%): Thick Cervical Position: Posterior Exam by:: Tyler Aas, CNM  Musculoskeletal:        General: Normal range of motion.  Skin:    General: Skin is warm and dry.     Capillary Refill: Capillary refill takes less than 2 seconds.  Neurological:     Mental Status: She is alert and oriented to person, place, and time.  Psychiatric:        Mood and Affect: Mood normal.        Behavior: Behavior normal.        Thought Content: Thought content normal.        Judgment: Judgment normal.    Results for orders placed or performed during the hospital encounter of 09/13/20  (from the past 24 hour(s))  Wet prep, genital     Status: Abnormal   Collection Time: 09/13/20  9:04 PM   Specimen: Cervix  Result Value Ref Range   Yeast Wet Prep HPF POC NONE SEEN NONE SEEN   Trich, Wet Prep NONE SEEN NONE SEEN   Clue Cells Wet Prep HPF POC NONE SEEN NONE SEEN   WBC, Wet Prep HPF POC MANY (A) NONE SEEN   Sperm NONE SEEN   Urinalysis, Routine w reflex microscopic Cervix     Status: Abnormal   Collection Time: 09/13/20  9:05 PM  Result Value Ref Range   Color, Urine AMBER (A) YELLOW   APPearance CLOUDY (A) CLEAR   Specific Gravity, Urine 1.029 1.005 - 1.030   pH 6.0 5.0 - 8.0   Glucose, UA NEGATIVE NEGATIVE mg/dL   Hgb urine dipstick NEGATIVE NEGATIVE   Bilirubin Urine NEGATIVE NEGATIVE   Ketones, ur 5 (A) NEGATIVE mg/dL   Protein, ur 088 (A) NEGATIVE mg/dL   Nitrite NEGATIVE NEGATIVE   Leukocytes,Ua MODERATE (A) NEGATIVE   RBC / HPF 6-10 0 - 5 RBC/hpf   WBC, UA 21-50 0 - 5 WBC/hpf  Bacteria, UA FEW (A) NONE SEEN   Squamous Epithelial / LPF 21-50 0 - 5   Mucus PRESENT    A Uterine cramping Mild dehydration NST reactive  P Discharge from MAU in stable condition in stable condition with preterm labor precautions Follow up at [facility] as scheduled for ongoing prenatal care Educated pt on the importance of hydration in pregnancy in preventing uterine irritability  Bernerd Limbo, CNM 09/13/2020 9:59 PM

## 2020-09-13 NOTE — Discharge Instructions (Signed)
Dehydration, Adult Dehydration is condition in which there is not enough water or other fluids in the body. This happens when a person loses more fluids than he or she takes in. Important body parts cannot work right without the right amount of fluids. Any loss of fluids from the body can cause dehydration. Dehydration can be mild, worse, or very bad. It should be treated right away to keep it from getting very bad. What are the causes? This condition may be caused by:  Conditions that cause loss of water or other fluids, such as: ? Watery poop (diarrhea). ? Vomiting. ? Sweating a lot. ? Peeing (urinating) a lot.  Not drinking enough fluids, especially when you: ? Are ill. ? Are doing things that take a lot of energy to do.  Other illnesses and conditions, such as fever or infection.  Certain medicines, such as medicines that take extra fluid out of the body (diuretics).  Lack of safe drinking water.  Not being able to get enough water and food. What increases the risk? The following factors may make you more likely to develop this condition:  Having a long-term (chronic) illness that has not been treated the right way, such as: ? Diabetes. ? Heart disease. ? Kidney disease.  Being 65 years of age or older.  Having a disability.  Living in a place that is high above the ground or sea (high in altitude). The thinner, dried air causes more fluid loss.  Doing exercises that put stress on your body for a long time. What are the signs or symptoms? Symptoms of dehydration depend on how bad it is. Mild or worse dehydration  Thirst.  Dry lips or dry mouth.  Feeling dizzy or light-headed, especially when you stand up from sitting.  Muscle cramps.  Your body making: ? Dark pee (urine). Pee may be the color of tea. ? Less pee than normal. ? Less tears than normal.  Headache. Very bad dehydration  Changes in skin. Skin may: ? Be cold to the touch (clammy). ? Be blotchy  or pale. ? Not go back to normal right after you lightly pinch it and let it go.  Little or no tears, pee, or sweat.  Changes in vital signs, such as: ? Fast breathing. ? Low blood pressure. ? Weak pulse. ? Pulse that is more than 100 beats a minute when you are sitting still.  Other changes, such as: ? Feeling very thirsty. ? Eyes that look hollow (sunken). ? Cold hands and feet. ? Being mixed up (confused). ? Being very tired (lethargic) or having trouble waking from sleep. ? Short-term weight loss. ? Loss of consciousness. How is this treated? Treatment for this condition depends on how bad it is. Treatment should start right away. Do not wait until your condition gets very bad. Very bad dehydration is an emergency. You will need to go to a hospital.  Mild or worse dehydration can be treated at home. You may be asked to: ? Drink more fluids. ? Drink an oral rehydration solution (ORS). This drink helps get the right amounts of fluids and salts and minerals in the blood (electrolytes).  Very bad dehydration can be treated: ? With fluids through an IV tube. ? By getting normal levels of salts and minerals in your blood. This is often done by giving salts and minerals through a tube. The tube is passed through your nose and into your stomach. ? By treating the root cause. Follow these instructions at   home: Oral rehydration solution If told by your doctor, drink an ORS:  Make an ORS. Use instructions on the package.  Start by drinking small amounts, about  cup (120 mL) every 5-10 minutes.  Slowly drink more until you have had the amount that your doctor said to have. Eating and drinking  Drink enough clear fluid to keep your pee pale yellow. If you were told to drink an ORS, finish the ORS first. Then, start slowly drinking other clear fluids. Drink fluids such as: ? Water. Do not drink only water. Doing that can make the salt (sodium) level in your body get too low. ? Water  from ice chips you suck on. ? Fruit juice that you have added water to (diluted). ? Low-calorie sports drinks.  Eat foods that have the right amounts of salts and minerals, such as: ? Bananas. ? Oranges. ? Potatoes. ? Tomatoes. ? Spinach.  Do not drink alcohol.  Avoid: ? Drinks that have a lot of sugar. These include:  High-calorie sports drinks.  Fruit juice that you did not add water to.  Soda.  Caffeine. ? Foods that are greasy or have a lot of fat or sugar.         General instructions  Take over-the-counter and prescription medicines only as told by your doctor.  Do not take salt tablets. Doing that can make the salt level in your body get too high.  Return to your normal activities as told by your doctor. Ask your doctor what activities are safe for you.  Keep all follow-up visits as told by your doctor. This is important. Contact a doctor if:  You have pain in your belly (abdomen) and the pain: ? Gets worse. ? Stays in one place.  You have a rash.  You have a stiff neck.  You get angry or annoyed (irritable) more easily than normal.  You are more tired or have a harder time waking than normal.  You feel: ? Weak or dizzy. ? Very thirsty. Get help right away if you have:  Any symptoms of very bad dehydration.  Symptoms of vomiting, such as: ? You cannot eat or drink without vomiting. ? Your vomiting gets worse or does not go away. ? Your vomit has blood or green stuff in it.  Symptoms that get worse with treatment.  A fever.  A very bad headache.  Problems with peeing or pooping (having a bowel movement), such as: ? Watery poop that gets worse or does not go away. ? Blood in your poop (stool). This may cause poop to look black and tarry. ? Not peeing in 6-8 hours. ? Peeing only a small amount of very dark pee in 6-8 hours.  Trouble breathing. These symptoms may be an emergency. Do not wait to see if the symptoms will go away. Get  medical help right away. Call your local emergency services (911 in the [place] Do not drive yourself to the hospital. Summary  Dehydration is a condition in which there is not enough water or other fluids in the body. This happens when a person loses more fluids than he or she takes in.  Treatment for this condition depends on how bad it is. Treatment should be started right away. Do not wait until your condition gets very bad.  Drink enough clear fluid to keep your pee pale yellow. If you were told to drink an oral rehydration solution (ORS), finish the ORS first. Then, start slowly drinking other clear fluids.    Take over-the-counter and prescription medicines only as told by your doctor.  Get help right away if you have any symptoms of very bad dehydration. This information is not intended to replace advice given to you by your health care provider. Make sure you discuss any questions you have with your health care provider. Document Revised: 03/07/2019 Document Reviewed: 03/07/2019 Elsevier Patient Education  2021 Elsevier Inc.  

## 2020-09-14 ENCOUNTER — Encounter: Payer: Self-pay | Admitting: *Deleted

## 2020-09-14 LAB — GC/CHLAMYDIA PROBE AMP (~~LOC~~) NOT AT ARMC
Chlamydia: NEGATIVE
Comment: NEGATIVE
Comment: NORMAL
Neisseria Gonorrhea: NEGATIVE

## 2020-09-15 LAB — CULTURE, OB URINE

## 2020-09-21 ENCOUNTER — Ambulatory Visit: Payer: Medicaid Other | Attending: Certified Nurse Midwife

## 2020-09-21 ENCOUNTER — Other Ambulatory Visit: Payer: Self-pay

## 2020-09-21 ENCOUNTER — Other Ambulatory Visit: Payer: Self-pay | Admitting: *Deleted

## 2020-09-21 DIAGNOSIS — Z3480 Encounter for supervision of other normal pregnancy, unspecified trimester: Secondary | ICD-10-CM

## 2020-09-21 DIAGNOSIS — Z3482 Encounter for supervision of other normal pregnancy, second trimester: Secondary | ICD-10-CM | POA: Diagnosis not present

## 2020-09-21 DIAGNOSIS — Z3A19 19 weeks gestation of pregnancy: Secondary | ICD-10-CM | POA: Insufficient documentation

## 2020-09-21 DIAGNOSIS — Z3493 Encounter for supervision of normal pregnancy, unspecified, third trimester: Secondary | ICD-10-CM

## 2020-09-25 ENCOUNTER — Other Ambulatory Visit (HOSPITAL_COMMUNITY)
Admission: RE | Admit: 2020-09-25 | Discharge: 2020-09-25 | Disposition: A | Discharge status: Home / Self Care | Payer: Medicaid Other | Source: Ambulatory Visit

## 2020-09-25 ENCOUNTER — Other Ambulatory Visit: Payer: Self-pay

## 2020-09-25 ENCOUNTER — Ambulatory Visit (INDEPENDENT_AMBULATORY_CARE_PROVIDER_SITE_OTHER): Payer: Medicaid Other

## 2020-09-25 VITALS — BP 132/76 | HR 97 | Temp 97.4°F | Wt 233.6 lb

## 2020-09-25 DIAGNOSIS — O26899 Other specified pregnancy related conditions, unspecified trimester: Secondary | ICD-10-CM | POA: Diagnosis present

## 2020-09-25 DIAGNOSIS — M5431 Sciatica, right side: Secondary | ICD-10-CM

## 2020-09-25 DIAGNOSIS — Z3480 Encounter for supervision of other normal pregnancy, unspecified trimester: Secondary | ICD-10-CM

## 2020-09-25 DIAGNOSIS — N898 Other specified noninflammatory disorders of vagina: Secondary | ICD-10-CM

## 2020-09-25 DIAGNOSIS — Z3A29 29 weeks gestation of pregnancy: Secondary | ICD-10-CM

## 2020-09-25 DIAGNOSIS — O24419 Gestational diabetes mellitus in pregnancy, unspecified control: Secondary | ICD-10-CM | POA: Insufficient documentation

## 2020-09-25 MED ORDER — CYCLOBENZAPRINE HCL 10 MG PO TABS: 10.0000 mg | ORAL_TABLET | Freq: Three times a day (TID) | ORAL | 0 refills | Status: DC | PRN

## 2020-09-25 NOTE — Patient Instructions (Signed)

## 2020-09-25 NOTE — Progress Notes (Signed)
LOW-RISK PREGNANCY OFFICE VISIT  Patient name: Felicia Harmon MRN [id number]  Date of birth: [birth year] Chief Complaint:   Routine Prenatal Visit  Subjective:   Felicia Harmon is a 20 y.o. G69P2002 female at [redacted]w[redacted]d with an Estimated Date of Delivery: 12/11/20 being seen today for ongoing management of a Low-risk pregnancy aeb has Adjustment disorder with mixed disturbance of emotions and conduct; Rapid heartbeat; Pica; Short interval between pregnancies affecting pregnancy, antepartum; Group beta Strep positive; Supervision of other normal pregnancy, antepartum; Iron deficiency anemia during pregnancy; Suicide attempt (HCC); Chlamydia infection affecting pregnancy in second trimester; and COVID-19 on their problem list.  Patient presents today with complaint of bleeding. She states she had one incident of blood in the toilet after a bowel movement. She did not notice blood on the tissue and had no other incidents after.  She also reports some right sided hip and pelvic pain that makes standing and resting on that side difficult.  Patient is unable to describe the pain and reports it is constant.  Patient denies vaginal concerns including abnormal discharge, leaking of fluid, and no current bleeding.  Contractions: Irritability. Vag. Bleeding: None.  Movement: Present.  Reviewed past medical,surgical, social, obstetrical and family history as well as problem list, medications and allergies.  Objective   Vitals:   09/25/20 0915  BP: 132/76  Pulse: 97  Temp: (!) 97.4 F (36.3 C)  Weight: 233 lb 9.6 oz (106 kg)  Body mass index is 35 kg/m.  Total Weight Gain:33 lb 9.6 oz (15.2 kg)         Physical Examination:   General appearance: Well appearing, and in no distress  Mental status: Alert, oriented to person, place, and time  Skin: Warm & dry  Cardiovascular: Normal heart rate noted  Respiratory: Normal respiratory effort, no distress  Abdomen: Soft, gravid, nontender, AGA with Fundal  height of Fundal Height: 30 cm  Pelvic: Cervical exam performed  Dilation: Closed Effacement (%): Thick Station: Ballotable Presentation: Undeterminable   -Speculum exam performed: Moderate amt frothy white discharge in pelvis.    Extremities: Edema: None  Fetal Status: Fetal Heart Rate (bpm): 136  Movement: Present   No results found for this or any previous visit (from the past 24 hour(s)).  Assessment & Plan:  Low-risk pregnancy of a 20 y.o., G3P2002 at [redacted]w[redacted]d with an Estimated Date of Delivery: 12/11/20   1. Supervision of other normal pregnancy, antepartum -Anticipatory guidance for upcoming appts. -Patient to next appt in 3 weeks for a virtual visit.  2. [redacted] weeks gestation of pregnancy -Reviewed complaints. -Patient doing well overall. -GTT performed today.  -Reviewed blood draw procedures and labs which also include check of iron level.  -Discussed how results of GTT are handled including diabetic education and BS testing for abnormal results and routine care for normal results.   3. Sciatica of right side without back pain -Discussed normalcy of sciatica during pregnancy.  Reviewed causes and how to obtain relief. -Encouraged usage of tylenol and/or flexeril for relief. -Will send script for flexeril and instructed to start with initial dose at night.  -Discussed possibility of PT referral with worsening symptoms.     Meds:  Meds ordered this encounter  Medications  . cyclobenzaprine (FLEXERIL) 10 MG tablet    Sig: Take 1 tablet (10 mg total) by mouth every 8 (eight) hours as needed for muscle spasms.    Dispense:  30 tablet    Refill:  0    Order Specific Question:  Supervising Provider    Answer:   Reva Bores [[id number]]   Labs/procedures today:  Lab Orders     Glucose Tolerance, 2 Hours w/1 Hour     HIV Antibody (routine testing w rflx)     CBC     RPR   Reviewed: Preterm labor symptoms and general obstetric precautions including but not limited to vaginal  bleeding, contractions, leaking of fluid and fetal movement were reviewed in detail with the patient.  All questions were answered.  Follow-up: Return in about 3 weeks (around 10/16/2020) for Virtual LR-ROB.  Orders Placed This Encounter  Procedures  . Glucose Tolerance, 2 Hours w/1 Hour  . HIV Antibody (routine testing w rflx)  . CBC  . RPR   Cherre Robins MSN, CNM 09/25/2020

## 2020-09-26 LAB — GLUCOSE TOLERANCE, 2 HOURS W/ 1HR
Glucose, 1 hour: 97 mg/dL (ref 65–179)
Glucose, 2 hour: 86 mg/dL (ref 65–152)
Glucose, Fasting: 100 mg/dL — ABNORMAL HIGH (ref 65–91)

## 2020-09-26 LAB — HIV ANTIBODY (ROUTINE TESTING W REFLEX): HIV Screen 4th Generation wRfx: NONREACTIVE

## 2020-09-26 LAB — CBC
Hematocrit: 28.7 % — ABNORMAL LOW (ref 34.0–46.6)
Hemoglobin: 9 g/dL — ABNORMAL LOW (ref 11.1–15.9)
MCH: 24.1 pg — ABNORMAL LOW (ref 26.6–33.0)
MCHC: 31.4 g/dL — ABNORMAL LOW (ref 31.5–35.7)
MCV: 77 fL — ABNORMAL LOW (ref 79–97)
Platelets: 285 10*3/uL (ref 150–450)
RBC: 3.74 x10E6/uL — ABNORMAL LOW (ref 3.77–5.28)
RDW: 14.2 % (ref 11.7–15.4)
WBC: 6.7 10*3/uL (ref 3.4–10.8)

## 2020-09-26 LAB — RPR: RPR Ser Ql: NONREACTIVE

## 2020-09-28 LAB — CERVICOVAGINAL ANCILLARY ONLY
Bacterial Vaginitis (gardnerella): POSITIVE — AB
Candida Glabrata: NEGATIVE
Candida Vaginitis: POSITIVE — AB
Chlamydia: NEGATIVE
Comment: NEGATIVE
Comment: NEGATIVE
Comment: NEGATIVE
Comment: NEGATIVE
Comment: NEGATIVE
Comment: NORMAL
Neisseria Gonorrhea: NEGATIVE
Trichomonas: NEGATIVE

## 2020-09-29 ENCOUNTER — Other Ambulatory Visit: Payer: Self-pay | Admitting: *Deleted

## 2020-09-29 ENCOUNTER — Other Ambulatory Visit: Payer: Self-pay

## 2020-09-29 DIAGNOSIS — O24419 Gestational diabetes mellitus in pregnancy, unspecified control: Secondary | ICD-10-CM

## 2020-09-29 DIAGNOSIS — O24410 Gestational diabetes mellitus in pregnancy, diet controlled: Secondary | ICD-10-CM

## 2020-09-29 DIAGNOSIS — D509 Iron deficiency anemia, unspecified: Secondary | ICD-10-CM

## 2020-09-29 DIAGNOSIS — B9689 Other specified bacterial agents as the cause of diseases classified elsewhere: Secondary | ICD-10-CM

## 2020-09-29 MED ORDER — ACCU-CHEK GUIDE W/DEVICE KIT: 1.0000 | PACK | Freq: Four times a day (QID) | 0 refills | Status: AC

## 2020-09-29 MED ORDER — METRONIDAZOLE 0.75 % VA GEL: 1.0000 | Freq: Every day | VAGINAL | 0 refills | Status: DC

## 2020-09-29 MED ORDER — ACCU-CHEK GUIDE VI STRP: ORAL_STRIP | 12 refills | Status: AC

## 2020-09-29 MED ORDER — ACCU-CHEK SOFTCLIX LANCETS MISC: 1.0000 | Freq: Four times a day (QID) | 12 refills | Status: AC

## 2020-09-29 NOTE — Progress Notes (Signed)
Patient informed of failed glucose test. DM supplies sent to pharmacy. Patient also made aware of low iron level and need for Venofer infusion x 2 and iron tablets PO. Venofer infusion first doses is scheduled for October 06, 2020 at 10 AM. 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 Will send patient a Mychart message with phone to call back.  Clovis Pu, RN

## 2020-10-06 ENCOUNTER — Encounter (HOSPITAL_COMMUNITY)
Admission: RE | Admit: 2020-10-06 | Discharge: 2020-10-06 | Disposition: A | Discharge status: Home / Self Care | Payer: Medicaid Other | Source: Ambulatory Visit

## 2020-10-06 ENCOUNTER — Other Ambulatory Visit: Payer: Self-pay

## 2020-10-06 DIAGNOSIS — D509 Iron deficiency anemia, unspecified: Secondary | ICD-10-CM | POA: Diagnosis not present

## 2020-10-06 DIAGNOSIS — O99019 Anemia complicating pregnancy, unspecified trimester: Secondary | ICD-10-CM | POA: Insufficient documentation

## 2020-10-06 MED ORDER — ALBUTEROL SULFATE (2.5 MG/3ML) 0.083% IN NEBU: 2.5000 mg | INHALATION_SOLUTION | Freq: Once | RESPIRATORY_TRACT | Status: DC | PRN

## 2020-10-06 MED ORDER — SODIUM CHLORIDE 0.9 % IV SOLN
200.0000 mg | Freq: Once | INTRAVENOUS | Status: DC
  Administered 2020-10-06: 200 mg via INTRAVENOUS
  Filled 2020-10-06: qty 10

## 2020-10-06 MED ORDER — SODIUM CHLORIDE 0.9 % IV SOLN: INTRAVENOUS | Status: DC | PRN

## 2020-10-06 MED ORDER — DIPHENHYDRAMINE HCL 50 MG/ML IJ SOLN: 25.0000 mg | Freq: Once | INTRAMUSCULAR | Status: DC | PRN

## 2020-10-06 MED ORDER — SODIUM CHLORIDE 0.9 % IV BOLUS: 500.0000 mL | Freq: Once | INTRAVENOUS | Status: DC | PRN

## 2020-10-06 MED ORDER — METHYLPREDNISOLONE SODIUM SUCC 125 MG IJ SOLR: 125.0000 mg | Freq: Once | INTRAMUSCULAR | Status: DC | PRN

## 2020-10-06 MED ORDER — EPINEPHRINE PF 1 MG/ML IJ SOLN: 0.3000 mg | Freq: Once | INTRAMUSCULAR | Status: DC | PRN

## 2020-10-09 ENCOUNTER — Encounter (HOSPITAL_COMMUNITY)
Admission: RE | Admit: 2020-10-09 | Discharge: 2020-10-09 | Disposition: A | Discharge status: Home / Self Care | Payer: Medicaid Other | Source: Ambulatory Visit

## 2020-10-09 ENCOUNTER — Inpatient Hospital Stay (HOSPITAL_COMMUNITY)
Admission: AD | Admit: 2020-10-09 | Discharge: 2020-10-09 | Disposition: A | Discharge status: Home / Self Care | Payer: Medicaid Other | Attending: Obstetrics & Gynecology | Admitting: Obstetrics & Gynecology

## 2020-10-09 ENCOUNTER — Encounter (HOSPITAL_COMMUNITY): Payer: Self-pay | Admitting: Obstetrics & Gynecology

## 2020-10-09 ENCOUNTER — Other Ambulatory Visit: Payer: Self-pay

## 2020-10-09 DIAGNOSIS — O99891 Other specified diseases and conditions complicating pregnancy: Secondary | ICD-10-CM | POA: Insufficient documentation

## 2020-10-09 DIAGNOSIS — O26893 Other specified pregnancy related conditions, third trimester: Secondary | ICD-10-CM | POA: Diagnosis not present

## 2020-10-09 DIAGNOSIS — O99019 Anemia complicating pregnancy, unspecified trimester: Secondary | ICD-10-CM | POA: Diagnosis not present

## 2020-10-09 DIAGNOSIS — Z3A31 31 weeks gestation of pregnancy: Secondary | ICD-10-CM | POA: Diagnosis not present

## 2020-10-09 DIAGNOSIS — M549 Dorsalgia, unspecified: Secondary | ICD-10-CM | POA: Diagnosis not present

## 2020-10-09 DIAGNOSIS — R102 Pelvic and perineal pain: Secondary | ICD-10-CM | POA: Insufficient documentation

## 2020-10-09 LAB — URINALYSIS, ROUTINE W REFLEX MICROSCOPIC
Bilirubin Urine: NEGATIVE
Glucose, UA: NEGATIVE mg/dL
Hgb urine dipstick: NEGATIVE
Ketones, ur: NEGATIVE mg/dL
Leukocytes,Ua: NEGATIVE
Nitrite: NEGATIVE
Protein, ur: NEGATIVE mg/dL
Specific Gravity, Urine: 1.025 (ref 1.005–1.030)
pH: 7 (ref 5.0–8.0)

## 2020-10-09 MED ORDER — SODIUM CHLORIDE 0.9 % IV SOLN
200.0000 mg | INTRAVENOUS | Status: AC
  Administered 2020-10-09: 200 mg via INTRAVENOUS
  Filled 2020-10-09: qty 10

## 2020-10-09 MED ORDER — OXYCODONE-ACETAMINOPHEN 5-325 MG PO TABS
2.0000 | ORAL_TABLET | Freq: Once | ORAL | Status: AC
  Administered 2020-10-09: 2 via ORAL
  Filled 2020-10-09: qty 2

## 2020-10-09 NOTE — Discharge Instructions (Signed)
Gestational Diabetes Mellitus, Self-Care Caring for yourself after a diagnosis of gestational diabetes mellitus means keeping your blood sugar under control. This can be done through nutrition, exercise, lifestyle changes, insulin and other medicines, and support from your health care team. Your health care provider will set individualized treatment goals for you. What are the risks? If left untreated, gestational diabetes can cause problems for mother and baby. For the mother Women who get gestational diabetes are more likely to:  Have labor induced and deliver early.  Have problems during labor and delivery, if the baby is larger than normal. This includes difficult labor and damage to the birth canal.  Have a cesarean delivery.  Have problems with blood pressure, including high blood pressure and preeclampsia.  Get it again if they become pregnant.  Develop type 2 diabetes in the future. For the baby Gestational diabetes that is not treated can cause the baby to have:  Low blood glucose (hypoglycemia).  Larger-than-normal body size (macrosomia).  Breathing problems. How to monitor blood glucose Check your blood glucose every day and as often as told by your health care provider. To do this: 1. Wash your hands with soap and water for at least 20 seconds. 2. Prick the side of your finger (not the tip) with the lancet. Use a different finger each time. 3. Gently rub the finger until a small drop of blood appears. 4. Follow instructions that come with your meter for inserting the test strip, applying blood to the strip, and getting the result. 5. Write down your result and any notes. Blood glucose goals are:  95 mg/dL (5.3 mmol/L) when fasting.  140 mg/dL (7.8 mmol/L) 1 hour after a meal.  120 mg/dL (6.7 mmol/L) 2 hours after a meal.   Follow these instructions at home: Medicines  Take over-the-counter and prescription medicines only as told by your health care  provider.  If your health care provider prescribed insulin or other diabetes medicines: ? Take them every day. ? Do not run out of insulin or other medicines. Plan ahead so you always have them available. Eating and drinking  Follow instructions from your health care provider about eating or drinking restrictions.  See a diet and nutrition expert (registered dietician) to help you create an eating plan that helps control your diabetes. The foods in this plan will include: ? Lean proteins. ? Complex carbohydrates. These are carbohydrates that contain fiber, have a lot of nutrients, and are digested slowly. They include dried beans, nuts, and whole grain breads, cereals, or pasta. ? Fresh fruits and vegetables. ? Low-fat dairy products. ? Healthy fats.  Eat healthy snacks between nutritious meals.  Drink enough fluid to keep your urine pale yellow.  Keep a record of the carbohydrates that you eat. To do this: ? Read food labels. ? Learn the standard serving sizes of foods.  Make a sick day plan with your health care provider before you get sick. Follow this plan whenever you cannot eat or drink as usual.   Activity  Do exercises as told by your health care provider.  Do 30 or more minutes of physical activity a day, or as much physical activity as your health care provider recommends. It may help to control blood glucose levels after a meal if you: ? Do 10 minutes of exercise after each meal. ? Start this exercise 30 minutes after the meal.  If you start a new exercise or activity, work with your health care provider to adjust your  insulin, other medicines, or food as needed. Lifestyle  Do not drink alcohol.  Do not use any products that contain nicotine or tobacco, such as cigarettes, e-cigarettes, and chewing tobacco. If you need help quitting, ask your health care provider.  Learn to manage stress. If you need help with this, ask your health care provider. Body care  Keep  your vaccines up to date.  Practice good oral hygiene. To do this: ? Clean your teeth and gums two times a day. ? Floss one or more times a day. ? Visit your dentist one or more times every 6 months.  Stay at a healthy weight while you are pregnant. Your expected weight gain depends on your BMI (body mass index) before pregnancy. General instructions  Talk with your health care provider about the risk for high blood pressure during pregnancy (preeclampsia and eclampsia).  Share your diabetes management plan with people in your workplace, school, and household.  Check your urine for ketones when sick and as told by your health care provider. Ketones are made by the liver when a lack of glucose forces the body to use fat for energy.  Carry a medical alert card or wear medical alert jewelry that says you have gestational diabetes.  Keep all follow-up visits. This is important. Get care after delivery  Have your blood glucose level checked with an oral glucose tolerance test (OGTT) 4-12 weeks after delivery.  Get screened for diabetes at least every 3 years, or as often as told by your health care provider. Where to find more information  American Diabetes Association (ADA): diabetes.org  Association of Diabetes Care & Education Specialists (ADCES): diabeteseducator.org  Centers for Disease Control and Prevention (CDC): TonerPromos.no  American Pregnancy Association: americanpregnancy.org  [place] Department of Agriculture MyPlate: WrestlingReporter.dk Contact a health care provider if:  Your blood glucose is above your target for two tests in a row.  You have a fever.  You have been sick for 2 days or more and are not getting better.  You have either of these problems for more than 6 hours: ? Vomiting every time you eat or drink. ? Diarrhea. Get help right away if you:  Become confused or cannot think clearly.  Have trouble breathing.  Have moderate or high ketones in your  urine.  Feel your baby is not moving as usual.  Develop unusual discharge or bleeding from your vagina.  Start having early (premature) contractions. Contractions may feel like a tightening in your lower abdomen  Have a severe headache. These symptoms may represent a serious problem that is an emergency. Do not wait to see if the symptoms will go away. Get medical help right away. Call your local emergency services (911 in the [place] Do not drive yourself to the hospital. Summary  Check your blood glucose every day during your pregnancy. Do this as often as told by your health care provider.  Take insulin or other diabetes medicines every day, if your health care provider prescribed them.  Have your blood glucose level checked 4-12 weeks after delivery.  Keep all follow-up visits. This is important. This information is not intended to replace advice given to you by your health care provider. Make sure you discuss any questions you have with your health care provider. Document Revised: 12/30/2019 Document Reviewed: 12/30/2019 Elsevier Patient Education  2021 Elsevier Inc. Back Pain in Pregnancy Back pain during pregnancy is common. Back pain may be caused by several factors that are related to changes during  your pregnancy. Follow these instructions at home: Managing pain, stiffness, and swelling  If directed, for sudden (acute) back pain, put ice on the painful area. ? Put ice in a plastic bag. ? Place a towel between your skin and the bag. ? Leave the ice on for 20 minutes, 2-3 times per day.  If directed, apply heat to the affected area before you exercise. Use the heat source that your health care provider recommends, such as a moist heat pack or a heating pad. ? Place a towel between your skin and the heat source. ? Leave the heat on for 20-30 minutes. ? Remove the heat if your skin turns bright red. This is especially important if you are unable to feel pain, heat, or cold.  You may have a greater risk of getting burned.  If directed, massage the affected area.      Activity  Exercise as told by your health care provider. Gentle exercise is the best way to prevent or manage back pain.  Listen to your body when lifting. If lifting hurts, ask for help or bend your knees. This uses your leg muscles instead of your back muscles.  Squat down when picking up something from the floor. Do not bend over.  Only use bed rest for short periods as told by your health care provider. Bed rest should only be used for the most severe episodes of back pain. Standing, sitting, and lying down  Do not stand in one place for long periods of time.  Use good posture when sitting. Make sure your head rests over your shoulders and is not hanging forward. Use a pillow on your lower back if necessary.  Try sleeping on your side, preferably the left side, with a pregnancy support pillow or 1-2 regular pillows between your legs. ? If you have back pain after a night's rest, your bed may be too soft. ? A firm mattress may provide more support for your back during pregnancy. General instructions  Do not wear high heels.  Eat a healthy diet. Try to gain weight within your health care provider's recommendations.  Use a maternity girdle, elastic sling, or back brace as told by your health care provider.  Take over-the-counter and prescription medicines only as told by your health care provider.  Work with a physical therapist or massage therapist to find ways to manage back pain. Acupuncture or massage therapy may be helpful.  Keep all follow-up visits as told by your health care provider. This is important. Contact a health care provider if:  Your back pain interferes with your daily activities.  You have increasing pain in other parts of your body. Get help right away if:  You develop numbness, tingling, weakness, or problems with the use of your arms or legs.  You develop  severe back pain that is not controlled with medicine.  You have a change in bowel or bladder control.  You develop shortness of breath, dizziness, or you faint.  You develop nausea, vomiting, or sweating.  You have back pain that is a rhythmic, cramping pain similar to labor pains. Labor pain is usually 1-2 minutes apart, lasts for about 1 minute, and involves a bearing down feeling or pressure in your pelvis.  You have back pain and your water breaks or you have vaginal bleeding.  You have back pain or numbness that travels down your leg.  Your back pain developed after you fell.  You develop pain on one side of your  back.  You see blood in your urine.  You develop skin blisters in the area of your back pain. Summary  Back pain may be caused by several factors that are related to changes during your pregnancy.  Follow instructions as told by your health care provider for managing pain, stiffness, and swelling.  Exercise as told by your health care provider. Gentle exercise is the best way to prevent or manage back pain.  Take over-the-counter and prescription medicines only as told by your health care provider.  Keep all follow-up visits as told by your health care provider. This is important. This information is not intended to replace advice given to you by your health care provider. Make sure you discuss any questions you have with your health care provider. Document Revised: 11/13/2018 Document Reviewed: 01/10/2018 Elsevier Patient Education  2021 [employer].

## 2020-10-09 NOTE — MAU Provider Note (Addendum)
History     CSN: [id number]  Arrival date and time: 10/09/20 1411   Event Date/Time   First Provider Initiated Contact with Patient 10/09/20 1501      Chief Complaint  Patient presents with   Pelvic Pain   Back Pain   HPI Felicia Harmon is a 20 y.o. G3P2002 at 66w0dwho presents with back pain & pelvic pain. Reports pelvic pain for the last few weeks that is progressively getting worse. When walking, pain radiates from right hip down leg. Has been using tylenol, flexeril, and maternity support belt without relief. Was told by her ob/gyn if symptoms don't improve she would be referred to PT.  Since last night has had low back pain. Pain is intermittent & sporadic. She has been feeling intermittent abdominal tightening but tightening is not associated with back pain.  Rates pain 9/10. Worse with movement and walking. Not improved with meds. Denies vaginal bleeding, fever, dysuria, LOF, hematuria, or flank pain. Reports good fetal movement.   OB History     Gravida  3   Para  2   Term  2   Preterm      AB      Living  2      SAB      IAB      Ectopic      Multiple  0   Live Births  2           Past Medical History:  Diagnosis Date   Anemia    Depression     Past Surgical History:  Procedure Laterality Date   NO PAST SURGERIES      Family History  Problem Relation Age of Onset   Healthy Mother    Healthy Father     Social History   Tobacco Use   Smoking status: Never Smoker   Smokeless tobacco: Never Used  Vaping Use   Vaping Use: Never used  Substance Use Topics   Alcohol use: No   Drug use: No    Allergies: No Known Allergies  Medications Prior to Admission  Medication Sig Dispense Refill Last Dose   acetaminophen (TYLENOL) 500 MG tablet Take 2 tablets (1,000 mg total) by mouth every 6 (six) hours as needed for fever or headache. 30 tablet 0 10/08/2020 at 1830   Accu-Chek Softclix Lancets lancets 1 each by Other route 4 (four) times  daily. Use as instructed 100 each 12    Blood Glucose Monitoring Suppl (ACCU-CHEK GUIDE) w/Device KIT 1 kit by Does not apply route in the morning, at noon, in the evening, and at bedtime. 1 kit 0    cyclobenzaprine (FLEXERIL) 10 MG tablet Take 1 tablet (10 mg total) by mouth every 8 (eight) hours as needed for muscle spasms. 30 tablet 0 10/06/2020   ferrous sulfate (FERROUSUL) 325 (65 FE) MG tablet Take 1 tablet (325 mg total) by mouth every other day. (Patient not taking: Reported on 09/02/2020) 60 tablet 1    glucose blood (ACCU-CHEK GUIDE) test strip Use as instructed 100 each 12    metroNIDAZOLE (METROGEL VAGINAL) 0.75 % vaginal gel Place 1 Applicatorful vaginally at bedtime. Insert one applicator, at bedtime, for 5 nights. 70 g 0    Prenatal Vit-Fe Phos-FA-Omega (VITAFOL GUMMIES) 3.33-0.333-34.8 MG CHEW Chew 3 each by mouth daily. (Patient not taking: No sig reported) 90 tablet 12    promethazine (PHENERGAN) 12.5 MG tablet Take 1-2 tablets (12.5-25 mg total) by mouth every 6 (six) hours as needed  for nausea or vomiting. (Patient not taking: Reported on 09/02/2020) 30 tablet 2     Review of Systems  Constitutional: Negative.   Gastrointestinal: Negative.   Genitourinary: Positive for pelvic pain. Negative for dysuria, flank pain, frequency, vaginal bleeding and vaginal discharge.  Musculoskeletal: Positive for back pain.       Right hip pain   Physical Exam   Blood pressure 121/83, pulse 88, temperature 98.1 F (36.7 C), temperature source Oral, resp. rate 20, height 5' 8.5" (1.74 m), weight 109.5 kg, last menstrual period 02/06/2020, SpO2 100 %, unknown if currently breastfeeding.  Physical Exam Vitals and nursing note reviewed. Exam conducted with a chaperone present.  Constitutional:      General: She is not in acute distress.    Appearance: Normal appearance.  HENT:     Head: Normocephalic and atraumatic.  Pulmonary:     Effort: Pulmonary effort is normal. No respiratory distress.   Abdominal:     Tenderness: There is no abdominal tenderness.  Genitourinary:    Comments: Dilation: Closed Effacement (%): Thick Station: -3 Exam by:: Jorje Guild NP  Skin:    General: Skin is warm and dry.  Neurological:     Mental Status: She is alert.  Psychiatric:        Mood and Affect: Mood normal.        Behavior: Behavior normal.    NST:  Baseline: 140 bpm, Variability: Good {> 6 bpm), Accelerations: Reactive and Decelerations: Absent  MAU Course  Procedures Results for orders placed or performed during the hospital encounter of 10/09/20 (from the past 24 hour(s))  Urinalysis, Routine w reflex microscopic Urine, Clean Catch     Status: Abnormal   Collection Time: 10/09/20  3:48 PM  Result Value Ref Range   Color, Urine AMBER (A) YELLOW   APPearance HAZY (A) CLEAR   Specific Gravity, Urine 1.025 1.005 - 1.030   pH 7.0 5.0 - 8.0   Glucose, UA NEGATIVE NEGATIVE mg/dL   Hgb urine dipstick NEGATIVE NEGATIVE   Bilirubin Urine NEGATIVE NEGATIVE   Ketones, ur NEGATIVE NEGATIVE mg/dL   Protein, ur NEGATIVE NEGATIVE mg/dL   Nitrite NEGATIVE NEGATIVE   Leukocytes,Ua NEGATIVE NEGATIVE    MDM Reactive fetal tracing. Irregular contractions and UI on monitor. Cervix closed/thick. No signs of labor.   Pelvic pain consistent with previous complaint - outpatient physical therapy referral placed in computer. Encouraged patient to continue meds & support belt until follow up with PT  Low back pain. U/a negative. No CVAT. Treated with percocet in MAU  Patient recently diagnosed with GDM. Has not scheduled appt with DE yet. Has only checked 3 blood sugars at home since last week, all of which were elevated. Discussed blood glucose monitoring at home & given BS log to start tracking with more regularity. Encouraged patient to schedule appt with DE (pt hadn't scheduled yet due to childcare). Has appointment in the office next week; told patient if BS remain elevated that she would  need to be started on meds  Assessment and Plan   1. Pelvic pain affecting pregnancy in third trimester, antepartum   2. Back pain affecting pregnancy in third trimester   3. [redacted] weeks gestation of pregnancy    -continue treatment of pelvic pain at home until f/u with PT -reviewed reasons to return to MAU -keep track of blood sugars & take log to office next week  Jorje Guild 10/09/2020, 5:35 PM   Attestation of Attending Supervision of Advanced Practice  Provider (PA/CNM/NP): Evaluation and management procedures were performed by the Advanced Practice Provider under my supervision and collaboration.  I have reviewed the Advanced Practice Provider's note and chart, and I agree with the management and plan. I have also made any necessary editorial changes.   Annalee Genta, DO Attending [place], [facility] for [facility], [facility] 10/13/2020 10:36 AM

## 2020-10-09 NOTE — MAU Note (Signed)
Presents with c/o lower back and pelvic pain.  Reports has had pelvic pain but it's gotten worse and the lower back pain began yesterday.  Reports took Tylenol last night @ 1830, no relief noted, states last took muscle relaxer 3 days ago.  Denies VB or LOF.  Endorses +FM.

## 2020-10-09 NOTE — MAU Provider Note (Incomplete)
History    Arrival date and time: 10/09/20 1411    Chief Complaint  Patient presents with  . Pelvic Pain  . Back Pain   HPI Felicia Harmon is a 20 y.o. at 65w0dby [place] 14wks with PMHx notable for GDM and iron deficiency anemia during pregnancy, who presents for back and right-sided pelvic pain. The lower back pain began yesterday and is intermittent and not aggravated or relieved by anything. This of less concern to her than the pelvic pain which she localizes to the area of the inguinal ligament extending posteriorly. [facility] It occurs when she goes from supine/sitting to standing, and is a 9/10 tearing pain that remits in about 1 minute. She says it "feels like the leg is out of its joint". She has not found relief in flexeril or acetaminophen. She has been wearing a maternity band but reports that it has not helped.  She denies any pain or burning with urination. She denies vaginal bleeding, LOF. FM+ and Braxton-Hicks contractions present.  09/25/20 diagnosis of GDM discussed with patient. She has taken 3 fasting blood glucose levels which were in the 120s, but has found it difficult to remember to test. She has not yet had diabetes education visit.   Review of discharge summary from [facility]  -09/25/20 received diagnosis and counseling about sciatic of right side and prescribed flexeril; GTT: GDM+  A/Positive/-- (12/15 1417)  OB History    Gravida  3   Para  2   Term  2   Preterm      AB      Living  2     SAB      IAB      Ectopic      Multiple  0   Live Births  2           Past Medical History:  Diagnosis Date  . Anemia   . Depression   . Labor and delivery, indication for care 12/19/2019  . Supervision of other normal pregnancy, antepartum 04/30/2019    Nursing Staff Provider Office Location  [place] Dating  Early Ultrasound Language  English Anatomy  [place] 09/19/19 Normal Flu Vaccine  Declined Genetic Screen  NIPS:  Low risk Girl  TDaP vaccine   10/08/19 Hgb A1C or  GTT Early 5.3 Third trimester 87-88-89 Rhogam  N/A   LAB RESULTS  Feeding Plan Formula Blood Type A/Positive/-- (11/06 1102)  Contraception Undecided Antibody Negative (11/06 1102) Ci    Past Surgical History:  Procedure Laterality Date  . NO PAST SURGERIES      Family History  Problem Relation Age of Onset  . Healthy Mother   . Healthy Father     Social History   Socioeconomic History  . Marital status: Single    Spouse name: Not on file  . Number of children: 2  . Years of education: Not on file  . Highest education level: 9th grade  Occupational History  . Occupation: unemployed  Tobacco Use  . Smoking status: Never Smoker  . Smokeless tobacco: Never Used  Vaping Use  . Vaping Use: Never used  Substance and Sexual Activity  . Alcohol use: No  . Drug use: No  . Sexual activity: Yes    Birth control/protection: None  Other Topics Concern  . Not on file  Social History Narrative  . Not on file  Social Determinants of Health   Financial Resource Strain: Not on file  Food Insecurity: Not on file  Transportation Needs: Not on file  Physical Activity: Not on file  Stress: Not on file  Social Connections: Not on file  Intimate Partner Violence: Not on file    No Known Allergies  No current facility-administered medications on file prior to encounter.   Current Outpatient Medications on File Prior to Encounter  Medication Sig Dispense Refill  . acetaminophen (TYLENOL) 500 MG tablet Take 2 tablets (1,000 mg total) by mouth every 6 (six) hours as needed for fever or headache. 30 tablet 0  . Accu-Chek Softclix Lancets lancets 1 each by Other route 4 (four) times daily. Use as instructed 100 each 12  . Blood Glucose Monitoring Suppl (ACCU-CHEK GUIDE) w/Device KIT 1 kit by Does not apply route in the morning, at noon, in the evening, and at bedtime. 1 kit 0   . cyclobenzaprine (FLEXERIL) 10 MG tablet Take 1 tablet (10 mg total) by mouth every 8 (eight) hours as needed for muscle spasms. 30 tablet 0  . ferrous sulfate (FERROUSUL) 325 (65 FE) MG tablet Take 1 tablet (325 mg total) by mouth every other day. (Patient not taking: Reported on 09/02/2020) 60 tablet 1  . glucose blood (ACCU-CHEK GUIDE) test strip Use as instructed 100 each 12  . metroNIDAZOLE (METROGEL VAGINAL) 0.75 % vaginal gel Place 1 Applicatorful vaginally at bedtime. Insert one applicator, at bedtime, for 5 nights. 70 g 0  . Prenatal Vit-Fe Phos-FA-Omega (VITAFOL GUMMIES) 3.33-0.333-34.8 MG CHEW Chew 3 each by mouth daily. (Patient not taking: No sig reported) 90 tablet 12  . promethazine (PHENERGAN) 12.5 MG tablet Take 1-2 tablets (12.5-25 mg total) by mouth every 6 (six) hours as needed for nausea or vomiting. (Patient not taking: Reported on 09/02/2020) 30 tablet 2     ROS Pertinent positives and negative per HPI, all others reviewed and negative  Physical Exam   BP 101/68 (BP Location: Right Arm)   Pulse 100   Temp 98.6 F (37 C) (Oral)   Resp 18   Ht 5' 8.5" (1.74 m)   Wt 109.5 kg   LMP 02/06/2020 (Approximate)   SpO2 99% Comment: room air  BMI 36.19 kg/m   Physical Exam  Cervical Exam Dilation: Closed Effacement (%): Thick Station: -3 Exam by:: Jorje Guild NP  Labs No results found for this or any previous visit (from the past 24 hour(s)).  Imaging No results found.  MAU Course  Procedures  Lab Orders     Urinalysis, Routine w reflex microscopic Urine, Clean Catch Meds ordered this encounter  Medications  . oxyCODONE-acetaminophen (PERCOCET/ROXICET) 5-325 MG per tablet 2 tablet   Imaging Orders  No imaging studies ordered today    Assessment and Plan   #Low back pain -Cervix closed and thick on exam, and not suspected to be associated with labor. Percocet prescribed for managing pain today. Pt instructed to take 1012m acetaminophen at home as  needed.  #Right-sided pelvic pain -Referral to PT for sciatica related to pregnancy.  #GDM -Patient encouraged to record daily fasting [facility] Recent results were in 120s and may require medication.  #FWB FHT Cat 1 NST: reactive  CDyanne Carrel

## 2020-10-13 ENCOUNTER — Ambulatory Visit (HOSPITAL_COMMUNITY): Payer: Medicaid Other

## 2020-10-16 ENCOUNTER — Ambulatory Visit (HOSPITAL_COMMUNITY): Payer: Medicaid Other

## 2020-10-16 ENCOUNTER — Encounter: Payer: Self-pay | Admitting: *Deleted

## 2020-10-16 ENCOUNTER — Telehealth (INDEPENDENT_AMBULATORY_CARE_PROVIDER_SITE_OTHER): Payer: Medicaid Other

## 2020-10-16 DIAGNOSIS — Z3A32 32 weeks gestation of pregnancy: Secondary | ICD-10-CM

## 2020-10-16 DIAGNOSIS — Z3480 Encounter for supervision of other normal pregnancy, unspecified trimester: Secondary | ICD-10-CM

## 2020-10-16 DIAGNOSIS — M549 Dorsalgia, unspecified: Secondary | ICD-10-CM

## 2020-10-16 DIAGNOSIS — O24410 Gestational diabetes mellitus in pregnancy, diet controlled: Secondary | ICD-10-CM

## 2020-10-16 DIAGNOSIS — O26893 Other specified pregnancy related conditions, third trimester: Secondary | ICD-10-CM

## 2020-10-16 DIAGNOSIS — R102 Pelvic and perineal pain: Secondary | ICD-10-CM

## 2020-10-16 DIAGNOSIS — O99891 Other specified diseases and conditions complicating pregnancy: Secondary | ICD-10-CM

## 2020-10-16 NOTE — Progress Notes (Signed)
+  fetal movements No concerns today

## 2020-10-16 NOTE — Progress Notes (Signed)
OBSTETRICS PRENATAL VIRTUAL VISIT ENCOUNTER NOTE  Provider location: [facility]   I connected with Felicia Harmon on 10/16/20 at  8:50 AM EST by MyChart Video Encounter at home and verified that I am speaking with the correct person using two identifiers.   I discussed the limitations, risks, security and privacy concerns of performing an evaluation and management service virtually and the availability of in person appointments. I also discussed with the patient that there may be a patient responsible charge related to this service. The patient expressed understanding and agreed to proceed. Subjective:  Felicia Harmon is a 20 y.o. G3P2002 at [redacted]w[redacted]d being seen today for ongoing prenatal care.  She is currently monitored for the following issues for this low-risk pregnancy and has Adjustment disorder with mixed disturbance of emotions and conduct; Rapid heartbeat; Pica; Short interval between pregnancies affecting pregnancy, antepartum; Group beta Strep positive; Supervision of other normal pregnancy, antepartum; Iron deficiency anemia during pregnancy; Suicide attempt (HCC); Chlamydia infection affecting pregnancy in second trimester; COVID-19; and Gestational diabetes mellitus (GDM) in third trimester on their problem list.  Patient reports backache and pelvic pain. Patient states she has been experiencing this pain despite a belly support band and flexeril usage.  She reports taking a percocet, while at MAU, with some relief.  Patient endorses fetal movement. Patient denies perception of abdominal cramping or contractions.  She states she has not been taking her blood sugars regularly. Denies any leaking of fluid.   The following portions of the patient's history were reviewed and updated as appropriate: allergies, current medications, past family history, past medical history, past social history, past surgical history and problem list.   Objective:  There were  no vitals filed for this visit.  Fetal Status:           General:  Alert, oriented and cooperative. Patient is in no acute distress.  Respiratory: Normal respiratory effort, no problems with respiration noted  Mental Status: Normal mood and affect. Normal behavior. Normal judgment and thought content.  Rest of physical exam deferred due to type of encounter  Imaging: [place] MFM OB COMP + 14 WK  Result Date: 09/21/2020 ----------------------------------------------------------------------  OBSTETRICS REPORT                       (Signed Final 09/21/2020 01:19 pm) ---------------------------------------------------------------------- Patient Info  ID #:       [id number]                          D.O.B.:  11-12-2000 (19 yrs)  Name:       Felicia Harmon                  Visit Date: 09/21/2020 10:30 am ---------------------------------------------------------------------- Performed By  Attending:        Ma Rings MD         Ref. Address:     Faculty  Performed By:     Ceasar Lund        [facility]  Referred By:      Sharyon CableVERONICA C                    ROGERS CNM ---------------------------------------------------------------------- Orders  #  Description                           Code        Ordered By  1  US MFM OB COMP + 14 WK                76805.01    VERONICA ROGERS ----------------------------------------------------------------------  #  Order #                     Accession #                Episode #  1  [id number]                   [id number]                 [id number] ---------------------------------------------------------------------- Indications  Late to prenatal care, third trimester         O09.33  Neg AFP  [redacted] weeks gestation of pregnancy                Z3A.28  ---------------------------------------------------------------------- Fetal Evaluation  Num Of Fetuses:         1  Fetal Heart Rate(bpm):  123  Cardiac Activity:       Observed  Presentation:           Cephalic  Placenta:               Anterior  P. Cord Insertion:      Visualized, central  Amniotic Fluid  AFI FV:      Within normal limits  AFI Sum(cm)     %Tile       Largest Pocket(cm)  16.04           58          5.95  RUQ(cm)       RLQ(cm)       LUQ(cm)        LLQ(cm)  2.63          5.35          5.95           2.11 ---------------------------------------------------------------------- Biometry  BPD:      75.5  mm     G. Age:  30w 2d         89  %    CI:        78.05   %    70 - 86  FL/HC:      21.0   %    18.8 - 20.6  HC:      270.4  mm     G. Age:  29w 3d         51  %    HC/AC:      1.11        1.05 - 1.21  AC:      243.7  mm     G. Age:  28w 4d         49  %    FL/BPD:     75.1   %    71 - 87  FL:       56.7  mm     G. Age:  29w 5d         74  %    FL/AC:      23.3   %    20 - 24  HUM:      52.1  mm     G. Age:  30w 3d         86  %  CER:      33.1  mm     G. Age:  28w 1d         44  %  LV:        2.6  mm  CM:        4.4  mm  Est. FW:    1353  gm           3 lb     67  % ---------------------------------------------------------------------- OB History  Gravidity:    3         Term:   2        Prem:   0        SAB:   0  TOP:          0       Ectopic:  0        Living: 2 ---------------------------------------------------------------------- Gestational Age  LMP:           32w 4d        Date:  02/06/20                 EDD:   11/12/20  U/S Today:     29w 4d                                        EDD:   12/03/20  Best:          28w 3d     Det. ByMarcella Dubs         EDD:   12/11/20                                      (04/19/20) ---------------------------------------------------------------------- Anatomy  Cranium:               Appears normal          LVOT:                   Appears normal  Cavum:                 Appears normal         Aortic Arch:  Appears normal  Ventricles:            Appears normal         Ductal Arch:            Appears normal  Choroid Plexus:        Appears normal         Diaphragm:              Appears normal  Cerebellum:            Appears normal         Stomach:                Appears normal, left                                                                        sided  Posterior Fossa:       Appears normal         Abdomen:                Appears normal  Nuchal Fold:           Appears normal         Abdominal Wall:         Appears nml (cord                                                                        insert, abd wall)  Face:                  Appears normal         Cord Vessels:           Appears normal (3                         (orbits and profile)                           vessel cord)  Lips:                  Appears normal         Kidneys:                Appear normal  Palate:                Appears normal         Bladder:                Appears normal  Thoracic:              Appears normal         Spine:                  Not well visualized  Heart:                 Appears normal  Upper Extremities:      Appears normal                         (4CH, axis, and                         situs)  RVOT:                  Appears normal         Lower Extremities:      Appears normal  Other:  Hands not well visualized. Heels appear normal. Fetus appears to be          female. SVC IVC appears normal. 3VV/T appears normal. ---------------------------------------------------------------------- Cervix Uterus Adnexa  Cervix  Length:           3.62  cm.  Normal appearance by transabdominal scan.  Right Ovary  Appears normal  Left Ovary  Appears normal ---------------------------------------------------------------------- Comments  This patient was seen for a detailed fetal anatomy scan as  she presented late for prenatal  care.  The patient reports that  her [facility] of Dec 11, 2020 was based on a 6+ week ultrasound  performed in the emergency department.  She denies any significant past medical history and denies  any problems in her current pregnancy.  She had a cell free DNA test earlier in her pregnancy which  indicated a low risk for trisomy 4, 88, and 13. A female fetus  is predicted.  The fetal biometry measurements obtained today measures  about 8 days ahead of her dates based on the 6+ week  ultrasound.  The views of the fetal anatomy were limited today due to her  advanced gestational age.  The patient was informed that anomalies may be missed due  to technical limitations. If the fetus is in a suboptimal position  or maternal habitus is increased, visualization of the fetus in  the maternal uterus may be impaired.  A follow-up exam was scheduled in 4 weeks to confirm her  dates. ----------------------------------------------------------------------                   Ma Rings, MD Electronically Signed Final Report   09/21/2020 01:19 pm ----------------------------------------------------------------------   Assessment and Plan:  Pregnancy: [id number] at [redacted]w[redacted]d  1. Supervision of other normal pregnancy, antepartum -Anticipatory guidance for upcoming appts. -Patient to next appt in 2 weeks for a virtual visit.  2. Back pain affecting pregnancy in third trimester -Reassured that back pain is anticipated with pregnancy esp in setting of short interval.   3. Pelvic pain affecting pregnancy in third trimester, antepartum -Reassured that pelvic pain is anticipated. -Discussed continued usage of belly band. -Informed that symptoms sound c/w pelvic symphysis dysfunction. -Patient reports she is on wait list for PT. -Encouraged rest and warm compresses for back and pelvic area. -Encouraged to report to MAU for evaluation when necessary.   4. [redacted] weeks gestation of pregnancy -Reviewed complaints as above.  -Discussed  regular   5. Diet controlled gestational diabetes mellitus (GDM), antepartum -Has not been taking BS regularly. -3/5: 79, 103, 96, 124 -3/6: 88, 100, 93, 105  Preterm labor symptoms and general obstetric precautions including but not limited to vaginal bleeding, contractions, leaking of fluid and fetal movement were reviewed in detail with the patient. I discussed the assessment and treatment plan with the patient. The patient was provided an opportunity to ask questions and all were answered.  The patient agreed with the plan and demonstrated an understanding of the instructions. The patient was advised to call back or seek an in-person office evaluation/go to MAU at [facility] for any urgent or concerning symptoms. Please refer to After Visit Summary for other counseling recommendations.   I provided 10 minutes of face-to-face time during this encounter.  No follow-ups on file.  Future Appointments  Date Time Provider Department Center  10/19/2020  7:15 AM [facility] NURSE [facility]  10/19/2020  7:30 AM [facility] US3 [facility]    Cherre Robins, CNM Center for [employer], [facility]

## 2020-10-18 ENCOUNTER — Other Ambulatory Visit: Payer: Self-pay

## 2020-10-18 ENCOUNTER — Encounter (HOSPITAL_COMMUNITY): Payer: Self-pay | Admitting: Family Medicine

## 2020-10-18 ENCOUNTER — Inpatient Hospital Stay (HOSPITAL_COMMUNITY)
Admission: AD | Admit: 2020-10-18 | Discharge: 2020-10-18 | Disposition: A | Discharge status: Home / Self Care | Payer: Medicaid Other | Attending: Family Medicine | Admitting: Family Medicine

## 2020-10-18 DIAGNOSIS — O4703 False labor before 37 completed weeks of gestation, third trimester: Secondary | ICD-10-CM

## 2020-10-18 DIAGNOSIS — Z3A32 32 weeks gestation of pregnancy: Secondary | ICD-10-CM | POA: Insufficient documentation

## 2020-10-18 HISTORY — DX: Gestational diabetes mellitus in pregnancy, unspecified control: O24.419

## 2020-10-18 HISTORY — DX: Type 2 diabetes mellitus without complications: E11.9

## 2020-10-18 LAB — URINALYSIS, ROUTINE W REFLEX MICROSCOPIC
Bacteria, UA: NONE SEEN
Bilirubin Urine: NEGATIVE
Glucose, UA: NEGATIVE mg/dL
Hgb urine dipstick: NEGATIVE
Ketones, ur: NEGATIVE mg/dL
Leukocytes,Ua: NEGATIVE
Nitrite: NEGATIVE
Protein, ur: 30 mg/dL — AB
Specific Gravity, Urine: 1.025 (ref 1.005–1.030)
pH: 6 (ref 5.0–8.0)

## 2020-10-18 LAB — WET PREP, GENITAL
Clue Cells Wet Prep HPF POC: NONE SEEN
Sperm: NONE SEEN
Trich, Wet Prep: NONE SEEN
WBC, Wet Prep HPF POC: NONE SEEN
Yeast Wet Prep HPF POC: NONE SEEN

## 2020-10-18 LAB — FETAL FIBRONECTIN: Fetal Fibronectin: NEGATIVE

## 2020-10-18 MED ORDER — NIFEDIPINE 10 MG PO CAPS
10.0000 mg | ORAL_CAPSULE | ORAL | Status: DC | PRN
  Administered 2020-10-18 (×2): 10 mg via ORAL
  Filled 2020-10-18 (×2): qty 1

## 2020-10-18 MED ORDER — NIFEDIPINE 10 MG PO CAPS: 10.0000 mg | ORAL_CAPSULE | Freq: Three times a day (TID) | ORAL | 0 refills | Status: DC | PRN

## 2020-10-18 NOTE — MAU Provider Note (Addendum)
CC:  Chief Complaint  Patient presents with  . Abdominal Pain     Event Date/Time   First Provider Initiated Contact with Patient 10/18/20 1951      HPI: Felicia Harmon is a 20 y.o. year old G82P2002 female at [redacted]w[redacted]d weeks gestation who presents to MAU reporting low abd cramping and low back pain. States she was having this pain at last virtual prenatal visit on 10/16/20 and they told her that if it got worse she should be evaluated. Low abd pain is intermittent. Back pain is constant. No relief w/ Tylenol, Flexeril.   Associated Sx: Pos for urinary frequency. Neg for fever, chills, GI complaints, urgency, hematuria, dysuria, flank pain, vaginal bleeding or vaginal discharge.  Vaginal bleeding: Denies Leaking of fluid: Denies Fetal movement: Nml  O:  Patient Vitals for the past 24 hrs:  BP Temp Temp src Pulse Resp SpO2 Height Weight  10/18/20 1912 (!) 111/52 98.8 F (37.1 C) Oral (!) 101 16 100 % 5' 8.5" (1.74 m) 110.2 kg    General: NAD Heart: Regular rate Lungs: Normal rate and effort Abd: Soft, NT, Gravid, S=D Pelvic: NEFG, no blood or LOF.  Dilation: Closed Effacement (%): 50 Cervical Position: Posterior Station: -3 Presentation: Undeterminable Exam by:: Dorathy Kinsman, CNM  EFM: 140, Moderate variability, 15 x 15 accelerations, no decelerations Toco: Frequent UI  Orders Placed This Encounter  Procedures  . Wet prep, genital  . Urinalysis, Routine w reflex microscopic Urine, Clean Catch  . Fetal fibronectin   Meds ordered this encounter  Medications  . NIFEdipine (PROCARDIA) capsule 10 mg  Push PO fluids.   Care of pt turned over to Cleone Slim, CNM at 2043.   Katrinka Blazing, [place], CNM 10/18/2020 8:42 PM  Results for orders placed or performed during the hospital encounter of 10/18/20 (from the past 24 hour(s))  Urinalysis, Routine w reflex microscopic Urine, Clean Catch     Status: Abnormal   Collection Time: 10/18/20  8:06 PM  Result Value Ref Range    Color, Urine YELLOW YELLOW   APPearance CLEAR CLEAR   Specific Gravity, Urine 1.025 1.005 - 1.030   pH 6.0 5.0 - 8.0   Glucose, UA NEGATIVE NEGATIVE mg/dL   Hgb urine dipstick NEGATIVE NEGATIVE   Bilirubin Urine NEGATIVE NEGATIVE   Ketones, ur NEGATIVE NEGATIVE mg/dL   Protein, ur 30 (A) NEGATIVE mg/dL   Nitrite NEGATIVE NEGATIVE   Leukocytes,Ua NEGATIVE NEGATIVE   RBC / HPF 0-5 0 - 5 RBC/hpf   WBC, UA 0-5 0 - 5 WBC/hpf   Bacteria, UA NONE SEEN NONE SEEN   Squamous Epithelial / LPF 0-5 0 - 5   Mucus PRESENT   Fetal fibronectin     Status: None   Collection Time: 10/18/20  8:06 PM  Result Value Ref Range   Fetal Fibronectin NEGATIVE NEGATIVE  Wet prep, genital     Status: None   Collection Time: 10/18/20  8:06 PM   Specimen: Genital  Result Value Ref Range   Yeast Wet Prep HPF POC NONE SEEN NONE SEEN   Trich, Wet Prep NONE SEEN NONE SEEN   Clue Cells Wet Prep HPF POC NONE SEEN NONE SEEN   WBC, Wet Prep HPF POC NONE SEEN NONE SEEN   Sperm NONE SEEN     After 2 doses of procardia, patient reports resolution of pain. Uterine irritability resolved. Cervix unchanged.   1. Preterm uterine contractions in third trimester, antepartum   2. [redacted] weeks gestation of pregnancy    -  Discharge home in stable condition -Rx for procardia sent to patient's pharmacy -Preterm labor precautions discussed -Patient advised to follow-up with OB as scheduled for prenatal care -Patient may return to MAU as needed or if her condition were to change or worsen  Rolm Bookbinder, CNM 10/18/20 10:53 PM

## 2020-10-18 NOTE — Discharge Instructions (Signed)

## 2020-10-18 NOTE — MAU Note (Signed)
Pt reports she is cramping and having lower back pain since last pm. Took tylenol last pm without relief. Has been having this pain for a while and has muscle relaxants and they do not help either. Denies bleeding or dysuria. Reports good fetal movement.

## 2020-10-19 ENCOUNTER — Other Ambulatory Visit: Payer: Self-pay | Admitting: *Deleted

## 2020-10-19 ENCOUNTER — Ambulatory Visit: Payer: Medicaid Other | Attending: Obstetrics

## 2020-10-19 ENCOUNTER — Encounter: Payer: Self-pay | Admitting: *Deleted

## 2020-10-19 ENCOUNTER — Ambulatory Visit: Payer: Medicaid Other | Admitting: *Deleted

## 2020-10-19 DIAGNOSIS — Z3480 Encounter for supervision of other normal pregnancy, unspecified trimester: Secondary | ICD-10-CM

## 2020-10-19 DIAGNOSIS — Z3493 Encounter for supervision of normal pregnancy, unspecified, third trimester: Secondary | ICD-10-CM

## 2020-10-19 DIAGNOSIS — O24419 Gestational diabetes mellitus in pregnancy, unspecified control: Secondary | ICD-10-CM

## 2020-10-19 NOTE — Progress Notes (Unsigned)
To whom it may concern:  Felicia Harmon was seen in our clinic on March 14th, 2022 for a follow-up growth ultrasound.   She will be able to return to work

## 2020-10-21 ENCOUNTER — Encounter: Payer: Medicaid Other | Admitting: Registered"

## 2020-11-02 ENCOUNTER — Other Ambulatory Visit: Payer: Self-pay

## 2020-11-02 ENCOUNTER — Encounter (HOSPITAL_COMMUNITY): Payer: Self-pay | Admitting: Family Medicine

## 2020-11-02 ENCOUNTER — Inpatient Hospital Stay (HOSPITAL_COMMUNITY)
Admission: AD | Admit: 2020-11-02 | Discharge: 2020-11-02 | Disposition: A | Discharge status: Home / Self Care | Payer: Medicaid Other | Attending: Family Medicine | Admitting: Family Medicine

## 2020-11-02 DIAGNOSIS — R102 Pelvic and perineal pain: Secondary | ICD-10-CM

## 2020-11-02 DIAGNOSIS — O26893 Other specified pregnancy related conditions, third trimester: Secondary | ICD-10-CM | POA: Diagnosis present

## 2020-11-02 DIAGNOSIS — M549 Dorsalgia, unspecified: Secondary | ICD-10-CM

## 2020-11-02 DIAGNOSIS — Z3A34 34 weeks gestation of pregnancy: Secondary | ICD-10-CM

## 2020-11-02 DIAGNOSIS — O24113 Pre-existing diabetes mellitus, type 2, in pregnancy, third trimester: Secondary | ICD-10-CM | POA: Diagnosis not present

## 2020-11-02 DIAGNOSIS — O99891 Other specified diseases and conditions complicating pregnancy: Secondary | ICD-10-CM

## 2020-11-02 LAB — URINALYSIS, ROUTINE W REFLEX MICROSCOPIC
Bilirubin Urine: NEGATIVE
Glucose, UA: NEGATIVE mg/dL
Hgb urine dipstick: NEGATIVE
Ketones, ur: 5 mg/dL — AB
Nitrite: NEGATIVE
Protein, ur: 30 mg/dL — AB
Specific Gravity, Urine: 1.026 (ref 1.005–1.030)
pH: 6 (ref 5.0–8.0)

## 2020-11-02 MED ORDER — CYCLOBENZAPRINE HCL 5 MG PO TABS
10.0000 mg | ORAL_TABLET | Freq: Once | ORAL | Status: AC
  Administered 2020-11-02: 10 mg via ORAL
  Filled 2020-11-02: qty 2

## 2020-11-02 NOTE — Discharge Instructions (Signed)
Return to MAU if you have more than 6 PAINFUL contractions in 1 hour, vaginal bleeding like a period, leaking/gushing fluid, or no or decreased fetal movement.

## 2020-11-02 NOTE — MAU Provider Note (Signed)
History     CSN: [id number]  Arrival date and time: 11/02/20 1301   Event Date/Time   First Provider Initiated Contact with Patient 11/02/20 1338      Chief Complaint  Patient presents with  . Contractions  . Decreased Fetal Movement   [name] is a 20 y.o. year old G44P2002 female at 79w3dweeks gestation who presents to MAU reporting intermittent, lower abdominal cramping/pressure since last night. She is not sure how often the cramping occurs. She states that she was doing laundry all day yesterday and noticed that she was having to pee every 5-6 mins. Once she emptied her bladder, the abdominal cramping/pressure was resolve. She reports low back pain that is not a new symptom. She was prescribed Flexeril and a maternity support belt, but "neither one works for the back pain." She has occasionally taken Tylenol; which she reports "does not work."   OB History    Gravida  3   Para  2   Term  2   Preterm      AB      Living  2     SAB      IAB      Ectopic      Multiple  0   Live Births  2           Past Medical History:  Diagnosis Date  . Anemia   . Depression   . [place]   . Gestational diabetes     Past Surgical History:  Procedure Laterality Date  . NO PAST SURGERIES      Family History  Problem Relation Age of Onset  . Healthy Mother   . Healthy Father     Social History   Tobacco Use  . Smoking status: Never Smoker  . Smokeless tobacco: Never Used  Vaping Use  . Vaping Use: Never used  Substance Use Topics  . Alcohol use: No  . Drug use: No    Allergies: No Known Allergies  Medications Prior to Admission  Medication Sig Dispense Refill Last Dose  . Accu-Chek Softclix Lancets lancets 1 each by Other route 4 (four) times daily. Use as instructed (Patient not taking: Reported on 10/16/2020) 100 each 12   . acetaminophen (TYLENOL) 500 MG tablet Take 2 tablets (1,000 mg total) by mouth  every 6 (six) hours as needed for fever or headache. (Patient not taking: Reported on 10/16/2020) 30 tablet 0   . Blood Glucose Monitoring Suppl (ACCU-CHEK GUIDE) w/Device KIT 1 kit by Does not apply route in the morning, at noon, in the evening, and at bedtime. (Patient not taking: Reported on 10/16/2020) 1 kit 0   . cyclobenzaprine (FLEXERIL) 10 MG tablet Take 1 tablet (10 mg total) by mouth every 8 (eight) hours as needed for muscle spasms. 30 tablet 0   . ferrous sulfate (FERROUSUL) 325 (65 FE) MG tablet Take 1 tablet (325 mg total) by mouth every other day. (Patient not taking: No sig reported) 60 tablet 1   . glucose blood (ACCU-CHEK GUIDE) test strip Use as instructed 100 each 12   . NIFEdipine (PROCARDIA) 10 MG capsule Take 1 capsule (10 mg total) by mouth 3 (three) times daily as needed. (Patient not taking: Reported on 10/19/2020) 90 capsule 0   . Prenatal Vit-Fe Phos-FA-Omega (VITAFOL GUMMIES) 3.33-0.333-34.8 MG CHEW Chew 3 each by mouth daily. (Patient not taking: No sig reported) 90 tablet 12   . promethazine (PHENERGAN) 12.5 MG  tablet Take 1-2 tablets (12.5-25 mg total) by mouth every 6 (six) hours as needed for nausea or vomiting. (Patient not taking: No sig reported) 30 tablet 2     Review of Systems  Constitutional: Negative.   HENT: Negative.   Eyes: Negative.   Respiratory: Negative.   Cardiovascular: Negative.   Gastrointestinal: Negative.   Endocrine: Negative.   Genitourinary: Positive for frequency (peeing a lot more yesterday; going every 5-6 minutes) and pelvic pain (cramping and pressure).  Musculoskeletal: Positive for back pain (lower).  Skin: Negative.   Allergic/Immunologic: Negative.   Neurological: Negative.   Hematological: Negative.   Psychiatric/Behavioral: Negative.    Physical Exam   Blood pressure 108/62, pulse 87, temperature 98.7 F (37.1 C), temperature source Oral, resp. rate 17, height 5' 8.5" (1.74 m), weight 112.9 kg, last menstrual period  02/06/2020, SpO2 100 %, unknown if currently breastfeeding.  Physical Exam Constitutional:      Appearance: Normal appearance. She is obese.  Cardiovascular:     Rate and Rhythm: Normal rate.     Pulses: Normal pulses.  Pulmonary:     Effort: Pulmonary effort is normal.  Genitourinary:    General: Normal vulva.     Comments: Dilation: 1 Effacement (%): 70 Cervical Position: Posterior Station: Ballotable Presentation: Undeterminable Exam by: Laury Deep, CNM  Musculoskeletal:        General: Normal range of motion.  Skin:    General: Skin is warm and dry.  Neurological:     Mental Status: She is alert and oriented to person, place, and time.  Psychiatric:        Mood and Affect: Mood normal.        Behavior: Behavior normal.        Thought Content: Thought content normal.        Judgment: Judgment normal.    REACTIVE NST - FHR: 130 bpm / moderate variability / accels present / decels absent / TOCO: irregular UC's with UI noted  Reassessment of cervical exam @ 1614: unchanged from above MAU Course  Procedures  MDM CCUA UCx -- Results pending  CEFM Flexeril 10 mg -- improved back pain Push po fluids -- spaced out contractions and lessened pelvic pain/pressure  Results for orders placed or performed during the hospital encounter of 11/02/20 (from the past 24 hour(s))  Urinalysis, Routine w reflex microscopic Urine, Clean Catch     Status: Abnormal   Collection Time: 11/02/20  1:09 PM  Result Value Ref Range   Color, Urine YELLOW YELLOW   APPearance HAZY (A) CLEAR   Specific Gravity, Urine 1.026 1.005 - 1.030   pH 6.0 5.0 - 8.0   Glucose, UA NEGATIVE NEGATIVE mg/dL   Hgb urine dipstick NEGATIVE NEGATIVE   Bilirubin Urine NEGATIVE NEGATIVE   Ketones, ur 5 (A) NEGATIVE mg/dL   Protein, ur 30 (A) NEGATIVE mg/dL   Nitrite NEGATIVE NEGATIVE   Leukocytes,Ua TRACE (A) NEGATIVE   RBC / HPF 0-5 0 - 5 RBC/hpf   WBC, UA 0-5 0 - 5 WBC/hpf   Bacteria, UA RARE (A) NONE  SEEN   Squamous Epithelial / LPF 11-20 0 - 5   Mucus PRESENT     Assessment and Plan  Pelvic pain affecting pregnancy in third trimester, antepartum  - Information provided on preventing preterm birth - Advised to return to MAU for > 6 painful contractions in 1 hour   Back pain affecting pregnancy in third trimester  - Advised to continue taking Flexeril and Tylenol  prn for back pain  - Discharge patient - Keep scheduled appointment with [facility] on 11/04/20 - Patient verbalized an understanding of the plan of care and agrees.     Laury Deep, CNM 11/02/2020, 1:38 PM

## 2020-11-02 NOTE — MAU Note (Signed)
Felicia Harmon is a 20 y.o. at [redacted]w[redacted]d here in MAU reporting: lower abdominal cramping started last night, states it is intermittent and regular in nature but unsure about how often it comes. States also DFM today, only felt 1 movement and states the movement felt weak. No bleeding or LOF  Onset of complaint: today  Pain score: 6/10  FHT:132  Lab orders placed from triage: UA

## 2020-11-03 ENCOUNTER — Encounter: Payer: Medicaid Other | Attending: Student | Admitting: Physical Therapy

## 2020-11-04 ENCOUNTER — Other Ambulatory Visit: Payer: Self-pay

## 2020-11-04 ENCOUNTER — Ambulatory Visit (INDEPENDENT_AMBULATORY_CARE_PROVIDER_SITE_OTHER): Payer: Medicaid Other | Admitting: Certified Nurse Midwife

## 2020-11-04 VITALS — BP 142/83 | HR 90 | Temp 98.1°F | Wt 246.8 lb

## 2020-11-04 DIAGNOSIS — Z3480 Encounter for supervision of other normal pregnancy, unspecified trimester: Secondary | ICD-10-CM

## 2020-11-04 DIAGNOSIS — Z3A34 34 weeks gestation of pregnancy: Secondary | ICD-10-CM

## 2020-11-04 DIAGNOSIS — O169 Unspecified maternal hypertension, unspecified trimester: Secondary | ICD-10-CM

## 2020-11-04 LAB — CULTURE, OB URINE

## 2020-11-05 LAB — COMPREHENSIVE METABOLIC PANEL
ALT: 6 IU/L (ref 0–32)
AST: 12 IU/L (ref 0–40)
Albumin/Globulin Ratio: 1.3 (ref 1.2–2.2)
Albumin: 3.9 g/dL (ref 3.9–5.0)
Alkaline Phosphatase: 164 IU/L — ABNORMAL HIGH (ref 42–106)
BUN/Creatinine Ratio: 8 — ABNORMAL LOW (ref 9–23)
BUN: 4 mg/dL — ABNORMAL LOW (ref 6–20)
Bilirubin Total: 0.3 mg/dL (ref 0.0–1.2)
CO2: 18 mmol/L — ABNORMAL LOW (ref 20–29)
Calcium: 8.8 mg/dL (ref 8.7–10.2)
Chloride: 102 mmol/L (ref 96–106)
Creatinine, Ser: 0.49 mg/dL — ABNORMAL LOW (ref 0.57–1.00)
Globulin, Total: 2.9 g/dL (ref 1.5–4.5)
Glucose: 78 mg/dL (ref 65–99)
Potassium: 4 mmol/L (ref 3.5–5.2)
Sodium: 137 mmol/L (ref 134–144)
Total Protein: 6.8 g/dL (ref 6.0–8.5)
eGFR: 139 mL/min/{1.73_m2} (ref >59)

## 2020-11-05 LAB — CBC
Hematocrit: 30.7 % — ABNORMAL LOW (ref 34.0–46.6)
Hemoglobin: 9.6 g/dL — ABNORMAL LOW (ref 11.1–15.9)
MCH: 24.1 pg — ABNORMAL LOW (ref 26.6–33.0)
MCHC: 31.3 g/dL — ABNORMAL LOW (ref 31.5–35.7)
MCV: 77 fL — ABNORMAL LOW (ref 79–97)
Platelets: 292 10*3/uL (ref 150–450)
RBC: 3.98 x10E6/uL (ref 3.77–5.28)
RDW: 19.4 % — ABNORMAL HIGH (ref 11.7–15.4)
WBC: 8.7 10*3/uL (ref 3.4–10.8)

## 2020-11-05 LAB — PROTEIN / CREATININE RATIO, URINE
Creatinine, Urine: 199.1 mg/dL
Protein, Ur: 39 mg/dL
Protein/Creat Ratio: 196 mg/g creat (ref 0–200)

## 2020-11-07 NOTE — Progress Notes (Signed)
   PRENATAL VISIT NOTE  Subjective:  Felicia Harmon is a 20 y.o. G3P2002 at [redacted]w[redacted]d being seen today for ongoing prenatal care.  She is currently monitored for the following issues for this low-risk pregnancy and has Adjustment disorder with mixed disturbance of emotions and conduct; Rapid heartbeat; Pica; Short interval between pregnancies affecting pregnancy, antepartum; Group beta Strep positive; Supervision of other normal pregnancy, antepartum; Iron deficiency anemia during pregnancy; Suicide attempt ([place]); Chlamydia infection affecting pregnancy in second trimester; COVID-19; and Gestational diabetes mellitus (GDM) in third trimester on their problem list.  Patient reports back pain off and on, less today. Is not following diabetic diet or checking blood sugars regularly.  Contractions: Irregular. Vag. Bleeding: None.  Movement: Present. Denies leaking of fluid.   The following portions of the patient's history were reviewed and updated as appropriate: allergies, current medications, past family history, past medical history, past social history, past surgical history and problem list.   Objective:   Vitals:   11/04/20 1448  BP: (!) 142/83  Pulse: 90  Temp: 98.1 F (36.7 C)  Weight: 246 lb 12.8 oz (111.9 kg)    Fetal Status: Fetal Heart Rate (bpm): 136 Fundal Height: 38 cm Movement: Present     General:  Alert, oriented and cooperative. Patient is in no acute distress.  Skin: Skin is warm and dry. No rash noted.   Cardiovascular: Normal heart rate noted  Respiratory: Normal respiratory effort, no problems with respiration noted  Abdomen: Soft, gravid, appropriate for gestational age.  Pain/Pressure: Present     Pelvic: Cervical exam deferred        Extremities: Normal range of motion.  Edema: None  Mental Status: Normal mood and affect. Normal behavior. Normal judgment and thought content.   Assessment and Plan:  Pregnancy: G3P2002 at [redacted]w[redacted]d 1. Supervision of other normal  pregnancy, antepartum - Feeling plenty of fetal movement  2. [redacted] weeks gestation of pregnancy - Routine OB care - Measuring 4wks ahead, discussed importance of following diabetic protocol and testing sugars. Suggested setting 2hr alarm when she sits down to eat something so she remembers to test.  - Explained (at length) how GDM works, that it is caused by the placenta, and the risks associated with uncontrolled diabetes - Pt has follow-up ultrasound scheduled for next week - Strongly emphasized importance of kick counts and immediate presentation to MAU if she discerns DFM, pt expressed understanding  3. Elevated blood pressure affecting pregnancy, antepartum - Explained the relationship between uncontrolled diabetes and hypertension/preeclampsia - Protein / creatinine ratio, urine - Comp Met (CMET) - CBC - will follow and manage accordingly  Preterm labor symptoms and general obstetric precautions including but not limited to vaginal bleeding, contractions, leaking of fluid and fetal movement were reviewed in detail with the patient. Please refer to After Visit Summary for other counseling recommendations.   Return in about 2 weeks (around 11/18/2020) for IN-PERSON, LOB w GBS.  Future Appointments  Date Time Provider [place]  11/16/2020 11:00 AM [facility] NURSE [facility]  11/16/2020 11:15 AM [facility] US2 [facility]  11/19/2020  1:30 PM Laury Deep, [place] None    Gabriel Carina, CNM

## 2020-11-08 ENCOUNTER — Telehealth: Payer: Medicaid Other | Admitting: Nurse Practitioner

## 2020-11-08 DIAGNOSIS — O26899 Other specified pregnancy related conditions, unspecified trimester: Secondary | ICD-10-CM | POA: Diagnosis not present

## 2020-11-08 DIAGNOSIS — O26893 Other specified pregnancy related conditions, third trimester: Secondary | ICD-10-CM

## 2020-11-08 DIAGNOSIS — N898 Other specified noninflammatory disorders of vagina: Secondary | ICD-10-CM | POA: Diagnosis not present

## 2020-11-08 DIAGNOSIS — R109 Unspecified abdominal pain: Secondary | ICD-10-CM

## 2020-11-08 NOTE — Progress Notes (Signed)
Virtual Visit via Video Note  I connected with Jacarra Fineberg on 11/08/20 at  1:45 PM EDT by a video enabled telemedicine application and verified that I am speaking with the correct person using two identifiers.  Location: Patient: Felicia Harmon Provider: Nicoletta Ba, FNP-C   I discussed the limitations of evaluation and management by telemedicine and the availability of in person appointments. The patient expressed understanding and agreed to proceed.  History of Present Illness:   Patient is a 20 y.o. female who presents via video visit for OB concerns. She is approximately [redacted] weeks pregnant and complains of abdominal cramping x 2.5 days, and yellow discharge with wiping. She informs cramping feels like "period cramps" and constant pressure. She denies vaginal bleeding, nausea/vomiting, contractions, water breaking or decrease in fetal activity. She is seeing her OB team every 2 weeks at this time. She was seen on 3/28 for same/similar symptoms.   Observations/Objective:  Awake and alert, oriented x 3, NAD. Respiratory rate is not labored. No physical signs of discomfort noted. Affect is normal, speech is normal.  Assessment and Plan: Abdominal/Pelvic Pain  Follow Up Instructions: I recommended patient go to the ER for further evaluation. I am unable to determine via video visit an accurate diagnosis given the patient is currently [redacted] weeks pregnant. Verbalized to patient she should follow as well due her being close to her due date with symptoms of this nature. Patient verbalizes understanding, all questions answered.    I discussed the assessment and treatment plan with the patient. The patient was provided an opportunity to ask questions and all were answered. The patient agreed with the plan and demonstrated an understanding of the instructions.   The patient was advised to call back or seek an in-person evaluation if the symptoms worsen or if the condition fails to improve as  anticipated.  I provided 15 minutes of non-face-to-face time during this encounter.   Nicoletta Ba, NP

## 2020-11-08 NOTE — Progress Notes (Signed)
[name], Felicia Harmon are scheduled for a virtual visit with your provider today.    Just as we do with appointments in the office, we must obtain your consent to participate.  Your consent will be active for this visit and any virtual visit you may have with one of our providers in the next 365 days.    If you have a MyChart account, I can also send a copy of this consent to you electronically.  All virtual visits are billed to your insurance company just like a traditional visit in the office.  As this is a virtual visit, video technology does not allow for your provider to perform a traditional examination.  This may limit your provider's ability to fully assess your condition.  If your provider identifies any concerns that need to be evaluated in person or the need to arrange testing such as labs, EKG, etc, we will make arrangements to do so.    Although advances in technology are sophisticated, we cannot ensure that it will always work on either your end or our end.  If the connection with a video visit is poor, we may have to switch to a telephone visit.  With either a video or telephone visit, we are not always able to ensure that we have a secure connection.   I need to obtain your verbal consent now.   Are you willing to proceed with your visit today?   Felicia Harmon has provided verbal consent on 11/08/2020 for a virtual visit (video or telephone).   Nicoletta Ba, NP 11/08/2020  1:35 PM

## 2020-11-08 NOTE — Patient Instructions (Signed)
Go to the local ER for further evaluation.

## 2020-11-09 ENCOUNTER — Telehealth: Payer: Self-pay | Admitting: Certified Nurse Midwife

## 2020-11-09 ENCOUNTER — Encounter: Payer: Self-pay | Admitting: *Deleted

## 2020-11-09 ENCOUNTER — Ambulatory Visit: Payer: Medicaid Other | Attending: Obstetrics and Gynecology

## 2020-11-09 ENCOUNTER — Other Ambulatory Visit: Payer: Self-pay | Admitting: *Deleted

## 2020-11-09 ENCOUNTER — Encounter (HOSPITAL_COMMUNITY): Payer: Self-pay | Admitting: Obstetrics and Gynecology

## 2020-11-09 ENCOUNTER — Ambulatory Visit: Payer: Medicaid Other | Admitting: *Deleted

## 2020-11-09 ENCOUNTER — Other Ambulatory Visit: Payer: Self-pay

## 2020-11-09 ENCOUNTER — Inpatient Hospital Stay (HOSPITAL_COMMUNITY)
Admission: AD | Admit: 2020-11-09 | Discharge: 2020-11-09 | Disposition: A | Discharge status: Home / Self Care | Payer: Medicaid Other | Attending: Obstetrics and Gynecology | Admitting: Obstetrics and Gynecology

## 2020-11-09 DIAGNOSIS — B373 Candidiasis of vulva and vagina: Secondary | ICD-10-CM | POA: Diagnosis not present

## 2020-11-09 DIAGNOSIS — O26893 Other specified pregnancy related conditions, third trimester: Secondary | ICD-10-CM | POA: Diagnosis not present

## 2020-11-09 DIAGNOSIS — O24113 Pre-existing diabetes mellitus, type 2, in pregnancy, third trimester: Secondary | ICD-10-CM | POA: Insufficient documentation

## 2020-11-09 DIAGNOSIS — Z79899 Other long term (current) drug therapy: Secondary | ICD-10-CM | POA: Insufficient documentation

## 2020-11-09 DIAGNOSIS — Z3480 Encounter for supervision of other normal pregnancy, unspecified trimester: Secondary | ICD-10-CM

## 2020-11-09 DIAGNOSIS — O0933 Supervision of pregnancy with insufficient antenatal care, third trimester: Secondary | ICD-10-CM

## 2020-11-09 DIAGNOSIS — O99891 Other specified diseases and conditions complicating pregnancy: Secondary | ICD-10-CM

## 2020-11-09 DIAGNOSIS — M549 Dorsalgia, unspecified: Secondary | ICD-10-CM | POA: Diagnosis not present

## 2020-11-09 DIAGNOSIS — Z3A35 35 weeks gestation of pregnancy: Secondary | ICD-10-CM

## 2020-11-09 DIAGNOSIS — O98813 Other maternal infectious and parasitic diseases complicating pregnancy, third trimester: Secondary | ICD-10-CM | POA: Insufficient documentation

## 2020-11-09 DIAGNOSIS — Z362 Encounter for other antenatal screening follow-up: Secondary | ICD-10-CM | POA: Diagnosis not present

## 2020-11-09 DIAGNOSIS — O4703 False labor before 37 completed weeks of gestation, third trimester: Secondary | ICD-10-CM | POA: Diagnosis not present

## 2020-11-09 DIAGNOSIS — O24410 Gestational diabetes mellitus in pregnancy, diet controlled: Secondary | ICD-10-CM

## 2020-11-09 DIAGNOSIS — B3731 Acute candidiasis of vulva and vagina: Secondary | ICD-10-CM

## 2020-11-09 DIAGNOSIS — O26843 Uterine size-date discrepancy, third trimester: Secondary | ICD-10-CM | POA: Diagnosis not present

## 2020-11-09 DIAGNOSIS — O23593 Infection of other part of genital tract in pregnancy, third trimester: Secondary | ICD-10-CM

## 2020-11-09 DIAGNOSIS — O24419 Gestational diabetes mellitus in pregnancy, unspecified control: Secondary | ICD-10-CM | POA: Diagnosis not present

## 2020-11-09 LAB — GC/CHLAMYDIA PROBE AMP (~~LOC~~) NOT AT ARMC
Chlamydia: NEGATIVE
Comment: NEGATIVE
Comment: NORMAL
Neisseria Gonorrhea: NEGATIVE

## 2020-11-09 LAB — WET PREP, GENITAL
Clue Cells Wet Prep HPF POC: NONE SEEN
Sperm: NONE SEEN
Trich, Wet Prep: NONE SEEN

## 2020-11-09 MED ORDER — TERCONAZOLE 0.4 % VA CREA: 1.0000 | TOPICAL_CREAM | Freq: Every day | VAGINAL | 0 refills | Status: AC

## 2020-11-09 MED ORDER — CYCLOBENZAPRINE HCL 5 MG PO TABS
10.0000 mg | ORAL_TABLET | Freq: Once | ORAL | Status: AC
  Administered 2020-11-09: 10 mg via ORAL
  Filled 2020-11-09: qty 2

## 2020-11-09 MED ORDER — CYCLOBENZAPRINE HCL 10 MG PO TABS: 10.0000 mg | ORAL_TABLET | Freq: Three times a day (TID) | ORAL | 0 refills | Status: DC | PRN

## 2020-11-09 MED ORDER — TERBUTALINE SULFATE 1 MG/ML IJ SOLN
0.2500 mg | Freq: Once | INTRAMUSCULAR | Status: AC
  Administered 2020-11-09: 0.25 mg via SUBCUTANEOUS
  Filled 2020-11-09: qty 1

## 2020-11-09 NOTE — Telephone Encounter (Signed)
-----   Message from Bernerd Limbo, [place] sent at 11/07/2020  4:04 PM EDT ----- Regarding: Pt Needs Closer Follow Up Can you see if you can get this patient in with MFM this week (vs 11/16/20)? She also needs a BP/glucose log check this week - she can meet with just Tamika. If she continues to not check her glucose, she may need to be transferred to [facility] since she appears to be uncontrolled GDM and is therefore high risk. Thank you! Harvin Hazel

## 2020-11-09 NOTE — MAU Provider Note (Signed)
History     CSN: [id number]  Arrival date and time: 11/09/20 0121   Event Date/Time   First Provider Initiated Contact with Patient 11/09/20 0159      Chief Complaint  Patient presents with  . Abdominal Pain  . Back Pain   [name] is a 20 y.o. year old G79P2002 female at 38w3dweeks gestation who presents to MAU reporting lower abdominal and back pain since earlier yesterday. She describes the pain as strong like period cramps. She reports the pain woke her up out of her sleep at 2200. She take Tylenol for the pain, but "it doesn't help for long." She is not taking Flexeril at home. [facility]t is [facility]/2020.   OB History    Gravida  3   Para  2   Term  2   Preterm      AB      Living  2     SAB      IAB      Ectopic      Multiple  0   Live Births  2           Past Medical History:  Diagnosis Date  . Anemia   . Depression   . [place]   . Gestational diabetes     Past Surgical History:  Procedure Laterality Date  . NO PAST SURGERIES      Family History  Problem Relation Age of Onset  . Healthy Mother   . Healthy Father     Social History   Tobacco Use  . Smoking status: Never Smoker  . Smokeless tobacco: Never Used  Vaping Use  . Vaping Use: Never used  Substance Use Topics  . Alcohol use: No  . Drug use: No    Allergies: No Known Allergies  Medications Prior to Admission  Medication Sig Dispense Refill Last Dose  . acetaminophen (TYLENOL) 500 MG tablet Take 2 tablets (1,000 mg total) by mouth every 6 (six) hours as needed for fever or headache. 30 tablet 0 11/08/2020 at Unknown time  . glucose blood (ACCU-CHEK GUIDE) test strip Use as instructed 100 each 12 Past Month at Unknown time  . Accu-Chek Softclix Lancets lancets 1 each by Other route 4 (four) times daily. Use as instructed (Patient not taking: No sig reported) 100 each 12 Unknown at Unknown time  .  Blood Glucose Monitoring Suppl (ACCU-CHEK GUIDE) w/Device KIT 1 kit by Does not apply route in the morning, at noon, in the evening, and at bedtime. (Patient not taking: No sig reported) 1 kit 0 Unknown at Unknown time  . cyclobenzaprine (FLEXERIL) 10 MG tablet Take 1 tablet (10 mg total) by mouth every 8 (eight) hours as needed for muscle spasms. 30 tablet 0 Unknown at Unknown time  . ferrous sulfate (FERROUSUL) 325 (65 FE) MG tablet Take 1 tablet (325 mg total) by mouth every other day. (Patient not taking: No sig reported) 60 tablet 1 Unknown at Unknown time  . NIFEdipine (PROCARDIA) 10 MG capsule Take 1 capsule (10 mg total) by mouth 3 (three) times daily as needed. (Patient not taking: No sig reported) 90 capsule 0 Unknown at Unknown time  . Prenatal Vit-Fe Phos-FA-Omega (VITAFOL GUMMIES) 3.33-0.333-34.8 MG CHEW Chew 3 each by mouth daily. (Patient not taking: No sig reported) 90 tablet 12 Unknown at Unknown time  . promethazine (PHENERGAN) 12.5 MG tablet Take 1-2 tablets (12.5-25 mg total) by mouth  every 6 (six) hours as needed for nausea or vomiting. (Patient not taking: No sig reported) 30 tablet 2 Unknown at Unknown time    Review of Systems  Constitutional: Negative.   HENT: Negative.   Eyes: Negative.   Respiratory: Negative.   Cardiovascular: Negative.   Gastrointestinal: Negative.   Endocrine: Negative.   Genitourinary: Positive for pelvic pain and vaginal discharge (yellow).  Musculoskeletal: Positive for back pain.  Skin: Negative.   Allergic/Immunologic: Negative.   Hematological: Negative.   Psychiatric/Behavioral: Negative.    Physical Exam   Blood pressure 113/73, pulse (!) 102, resp. rate 18, height 5' 8.5" (1.74 m), weight 112.4 kg, last menstrual period 02/06/2020, SpO2 100 %, unknown if currently breastfeeding.  Physical Exam Vitals and nursing note reviewed. Exam conducted with a chaperone present.  Constitutional:      Appearance: Normal appearance. She is  obese.  Genitourinary:    General: Normal vulva.     Comments: Pelvic exam: External genitalia normal, SE: vaginal walls pink and well rugated, cervix is smooth, pink, no lesions, small amt of thick, white vaginal d/c -- WP, GC/CT done, Dilation: 2.5 Cervical Position: [place]: -3 Presentation: Vertex Exam by: Sunday Corn, CNM Uterus is non-tender, FH = 38 cm, no CMT or friability, no adnexal tenderness.  Skin:    General: Skin is warm and dry.  Neurological:     Mental Status: She is alert and oriented to person, place, and time.  Psychiatric:        Mood and Affect: Mood normal.        Behavior: Behavior normal.        Thought Content: Thought content normal.        Judgment: Judgment normal.    REACTIVE NST - FHR: 130 bpm / moderate variability / accels present / decels absent / TOCO: irregular UCs with UI noted   Reassessment @ 0330: patient still feeling contractions, cervical exam unchanged MAU Course  Procedures  MDM Wet Prep GC/CT -- Results pending  Flexeril 10 mg -- improved back pain  Results for orders placed or performed during the hospital encounter of 11/09/20 (from the past 24 hour(s))  Wet prep, genital     Status: Abnormal   Collection Time: 11/09/20  2:08 AM  Result Value Ref Range   Yeast Wet Prep HPF POC PRESENT (A) NONE SEEN   Trich, Wet Prep NONE SEEN NONE SEEN   Clue Cells Wet Prep HPF POC NONE SEEN NONE SEEN   WBC, Wet Prep HPF POC MANY (A) NONE SEEN   Sperm NONE SEEN     Assessment and Plan  Preterm uterine contractions in third trimester, antepartum - Information provided on preventing preterm birth, BH ctxs, and PTL - Return to MAU for > 6 painful UCs in 1 hour - Note to be OOW until Wednesday  11/11/20  Candida vaginitis - Rx for Terazol cream - Information provided on vaginal yeast infection   [redacted] weeks gestation of pregnancy  Uterine size date discrepancy pregnancy, third trimester - Fundal height = 38 cm  Back pain affecting  pregnancy in third trimester - Rx for Flexeril 10 mg - Information provided on back pain in preg     - Discharge patient - F/U at [facility] this week for cervical check - Patient verbalized an understanding of the plan of care and agrees.    Laury Deep, CNM 11/09/2020, 1:59 AM

## 2020-11-09 NOTE — MAU Note (Addendum)
Pt reports that she was having lower abdominal pain eariler yesterday and back pain.  She states that around 10 pm last night she had a sharp pain like strong period cramps that woke her out of her sleep and she has not been able to go back to bed.

## 2020-11-09 NOTE — Telephone Encounter (Signed)
Spoke with Felicia Harmon about coming in on 04/05 to get BP/glucose log check this week. Also was able to move patient ultrasound up to today at 2:30. Informed patient of this appointment, and to make sure she was on time. She stated she would.

## 2020-11-10 ENCOUNTER — Encounter: Payer: Self-pay | Admitting: Physical Therapy

## 2020-11-10 ENCOUNTER — Telehealth: Payer: Self-pay | Admitting: *Deleted

## 2020-11-10 ENCOUNTER — Ambulatory Visit: Payer: Medicaid Other

## 2020-11-10 NOTE — Telephone Encounter (Signed)
Patient called reporting she was unsure if her water broke. She was seen at MAU yesterday for abdominal pain and back pain. Patient reported that she got up for work this morning about 5 AM, urinated like normal. When she got to work about 8, she urinated again, but it felt different. She was having pressure. She is only slight cramping now. Denies bleeding. Advised patient to go to MAU for further evaluation if pain increased or having an increased fluid loss. Advised patient to track contractions, lay on left side and drink 32 oz of water. If those measures does not help, go to MAU. If patient does not go to MAU, she has an appointment tomorrow 11/11/20 at 8:30 AM with Thressa Sheller, CNM.  Clovis Pu, RN

## 2020-11-11 ENCOUNTER — Encounter: Payer: Self-pay | Admitting: Advanced Practice Midwife

## 2020-11-11 ENCOUNTER — Ambulatory Visit (INDEPENDENT_AMBULATORY_CARE_PROVIDER_SITE_OTHER): Payer: Medicaid Other | Admitting: Advanced Practice Midwife

## 2020-11-11 ENCOUNTER — Other Ambulatory Visit: Payer: Self-pay

## 2020-11-11 VITALS — BP 123/78 | HR 105 | Temp 98.6°F | Wt 250.6 lb

## 2020-11-11 DIAGNOSIS — O24419 Gestational diabetes mellitus in pregnancy, unspecified control: Secondary | ICD-10-CM | POA: Diagnosis not present

## 2020-11-11 DIAGNOSIS — Z3A35 35 weeks gestation of pregnancy: Secondary | ICD-10-CM

## 2020-11-11 DIAGNOSIS — Z3480 Encounter for supervision of other normal pregnancy, unspecified trimester: Secondary | ICD-10-CM

## 2020-11-11 LAB — GLUCOSE, POCT (MANUAL RESULT ENTRY): POC Glucose: 96 mg/dl (ref 70–99)

## 2020-11-11 NOTE — Progress Notes (Signed)
   PRENATAL VISIT NOTE  Subjective:  Felicia Harmon is a 20 y.o. G3P2002 at [redacted]w[redacted]d being seen today for ongoing prenatal care.  She is currently monitored for the following issues for this high-risk pregnancy and has Adjustment disorder with mixed disturbance of emotions and conduct; Rapid heartbeat; Pica; Short interval between pregnancies affecting pregnancy, antepartum; Group beta Strep positive; Supervision of other normal pregnancy, antepartum; Iron deficiency anemia during pregnancy; Suicide attempt ([facility]); Chlamydia infection affecting pregnancy in second trimester; COVID-19; Gestational diabetes mellitus (GDM) in third trimester; and Gestational diabetes on their problem list.  Patient reports no complaints.  Contractions: Irritability. Vag. Bleeding: None.  Movement: Present. Denies leaking of fluid.   The following portions of the patient's history were reviewed and updated as appropriate: allergies, current medications, past family history, past medical history, past social history, past surgical history and problem list.   Objective:   Vitals:   11/11/20 0839 11/11/20 0840  BP: (!) 147/82 123/78  Pulse: (!) 109 (!) 105  Temp: 98.6 F (37 C)   Weight: 250 lb 9.6 oz (113.7 kg)     Fetal Status: Fetal Heart Rate (bpm): 142   Movement: Present     General:  Alert, oriented and cooperative. Patient is in no acute distress.  Skin: Skin is warm and dry. No rash noted.   Cardiovascular: Normal heart rate noted  Respiratory: Normal respiratory effort, no problems with respiration noted  Abdomen: Soft, gravid, appropriate for gestational age.  Pain/Pressure: Present     Pelvic: Cervical exam performed in the presence of a chaperone Dilation: 1.5 Effacement (%): Thick    Extremities: Normal range of motion.  Edema: None  Mental Status: Normal mood and affect. Normal behavior. Normal judgment and thought content.    SSE: no pooling, no fluid seen with valsalva. Normal white discharge  noted. No ROM  Assessment and Plan:  Pregnancy: G3P2002 at [redacted]w[redacted]d 1. Supervision of other normal pregnancy, antepartum - patient is not checking her glucose levels, and thus does not have a glucose log to bring to [place].  - Per the mfm antenatal testing guidelines we have this would qualify as poor compliance and recommend 37 week IOL. She sees MFM next week on 11/18/2020 and we will have them weigh in on this.   2. [redacted] weeks gestation of pregnancy - GBS   Preterm labor symptoms and general obstetric precautions including but not limited to vaginal bleeding, contractions, leaking of fluid and fetal movement were reviewed in detail with the patient. Please refer to After Visit Summary for other counseling recommendations.   Return in about 1 week (around 11/18/2020).  Future Appointments  Date Time Provider Department Center  11/18/2020  1:45 PM [facility] NURSE [facility]  11/18/2020  2:00 PM [facility] US7 [facility]  11/19/2020  1:30 PM Raelyn Mora, CNM [facility] None  11/23/2020  2:45 PM [facility] NURSE [facility]  11/23/2020  3:00 PM [facility] US1 [facility]  12/03/2020  1:00 PM [facility] NURSE [facility]  12/03/2020  1:15 PM [facility] US2 [facility]    Thressa Sheller DNP, CNM  11/11/20  10:46 AM

## 2020-11-12 LAB — HEMOGLOBIN A1C
Est. average glucose Bld gHb Est-mCnc: 103 mg/dL
Hgb A1c MFr Bld: 5.2 % (ref 4.8–5.6)

## 2020-11-15 ENCOUNTER — Encounter (HOSPITAL_COMMUNITY): Payer: Self-pay | Admitting: Family Medicine

## 2020-11-15 ENCOUNTER — Other Ambulatory Visit: Payer: Self-pay

## 2020-11-15 ENCOUNTER — Inpatient Hospital Stay (HOSPITAL_COMMUNITY)
Admission: AD | Admit: 2020-11-15 | Discharge: 2020-11-15 | Disposition: A | Discharge status: Home / Self Care | Payer: Medicaid Other | Attending: Obstetrics and Gynecology | Admitting: Obstetrics and Gynecology

## 2020-11-15 DIAGNOSIS — O4703 False labor before 37 completed weeks of gestation, third trimester: Secondary | ICD-10-CM | POA: Diagnosis not present

## 2020-11-15 DIAGNOSIS — R102 Pelvic and perineal pain: Secondary | ICD-10-CM | POA: Insufficient documentation

## 2020-11-15 DIAGNOSIS — O479 False labor, unspecified: Secondary | ICD-10-CM | POA: Diagnosis not present

## 2020-11-15 DIAGNOSIS — Z3A36 36 weeks gestation of pregnancy: Secondary | ICD-10-CM

## 2020-11-15 DIAGNOSIS — O24410 Gestational diabetes mellitus in pregnancy, diet controlled: Secondary | ICD-10-CM

## 2020-11-15 DIAGNOSIS — M545 Low back pain, unspecified: Secondary | ICD-10-CM | POA: Diagnosis not present

## 2020-11-15 LAB — URINALYSIS, ROUTINE W REFLEX MICROSCOPIC
Bilirubin Urine: NEGATIVE
Glucose, UA: NEGATIVE mg/dL
Hgb urine dipstick: NEGATIVE
Ketones, ur: NEGATIVE mg/dL
Nitrite: NEGATIVE
Protein, ur: NEGATIVE mg/dL
Specific Gravity, Urine: 1.02 (ref 1.005–1.030)
pH: 7 (ref 5.0–8.0)

## 2020-11-15 LAB — GLUCOSE, CAPILLARY: Glucose-Capillary: 84 mg/dL (ref 70–99)

## 2020-11-15 NOTE — MAU Note (Signed)
Pt reports to mau with c/o lower abd pressure that is constant for the past 3 days.  Denies LOF or vag bleeding.  Pt also states her CBG was 180 last night, but reports it was 120 this morning.  +FM

## 2020-11-15 NOTE — Discharge Instructions (Signed)

## 2020-11-15 NOTE — MAU Provider Note (Signed)
History     CSN: [id number]  Arrival date and time: 11/15/20 1142   Event Date/Time   First Provider Initiated Contact with Patient 11/15/20 1243      Chief Complaint  Patient presents with  . Abdominal Pain   HPI Felicia Harmon is a 20 y.o. [id number] at [redacted]w[redacted]d who presents with abdominal pain. Reports abdominal cramping for the last few days. Has been having constant low back pain & pelvic pain that she's been taking flexeril for. Not is additionally having abdominal cramping. Denies n/v/d, dysuria, vaginal bleeding, or LOF. Reports good fetal movement. Rates pain 6/10. Took flexeril this morning. Nothing makes pain worse.  Also thinks her blood sugar was elevated last night. She is diet controlled gestational diabetic.   OB History    Gravida  3   Para  2   Term  2   Preterm      AB      Living  2     SAB      IAB      Ectopic      Multiple  0   Live Births  2           Past Medical History:  Diagnosis Date  . Anemia   . Depression   . Gestational diabetes     Past Surgical History:  Procedure Laterality Date  . NO PAST SURGERIES      Family History  Problem Relation Age of Onset  . Healthy Mother   . Healthy Father     Social History   Tobacco Use  . Smoking status: Never Smoker  . Smokeless tobacco: Never Used  Vaping Use  . Vaping Use: Never used  Substance Use Topics  . Alcohol use: No  . Drug use: No    Allergies: No Known Allergies  Medications Prior to Admission  Medication Sig Dispense Refill Last Dose  . Accu-Chek Softclix Lancets lancets 1 each by Other route 4 (four) times daily. Use as instructed (Patient not taking: No sig reported) 100 each 12   . acetaminophen (TYLENOL) 500 MG tablet Take 2 tablets (1,000 mg total) by mouth every 6 (six) hours as needed for fever or headache. 30 tablet 0   . Blood Glucose Monitoring Suppl (ACCU-CHEK GUIDE) w/Device KIT 1 kit by Does not apply route in the morning, at noon, in the  evening, and at bedtime. (Patient not taking: No sig reported) 1 kit 0   . cyclobenzaprine (FLEXERIL) 10 MG tablet Take 1 tablet (10 mg total) by mouth every 8 (eight) hours as needed for muscle spasms. 30 tablet 0   . ferrous sulfate (FERROUSUL) 325 (65 FE) MG tablet Take 1 tablet (325 mg total) by mouth every other day. (Patient not taking: No sig reported) 60 tablet 1   . glucose blood (ACCU-CHEK GUIDE) test strip Use as instructed 100 each 12   . NIFEdipine (PROCARDIA) 10 MG capsule Take 1 capsule (10 mg total) by mouth 3 (three) times daily as needed. (Patient not taking: No sig reported) 90 capsule 0   . Prenatal Vit-Fe Phos-FA-Omega (VITAFOL GUMMIES) 3.33-0.333-34.8 MG CHEW Chew 3 each by mouth daily. (Patient not taking: No sig reported) 90 tablet 12   . promethazine (PHENERGAN) 12.5 MG tablet Take 1-2 tablets (12.5-25 mg total) by mouth every 6 (six) hours as needed for nausea or vomiting. (Patient not taking: No sig reported) 30 tablet 2   . terconazole (TERAZOL 7) 0.4 % vaginal cream Place 1 applicator  vaginally at bedtime for 7 days. 45 g 0     Review of Systems  Constitutional: Negative.   Gastrointestinal: Positive for abdominal pain. Negative for blood in stool, nausea and vomiting.  Genitourinary: Positive for pelvic pain. Negative for dysuria, vaginal bleeding and vaginal discharge.  Musculoskeletal: Positive for back pain.   Physical Exam   Blood pressure 109/63, pulse (!) 103, temperature 98.2 F (36.8 C), temperature source Oral, resp. rate 15, last menstrual period 02/06/2020, SpO2 99 %, unknown if currently breastfeeding.  Physical Exam Vitals and nursing note reviewed. Exam conducted with a chaperone present.  Constitutional:      General: She is not in acute distress.    Appearance: She is well-developed.  HENT:     Head: Normocephalic and atraumatic.  Pulmonary:     Effort: Pulmonary effort is normal. No respiratory distress.  Abdominal:     Tenderness: There  is no abdominal tenderness.  Skin:    General: Skin is warm and dry.  Neurological:     Mental Status: She is alert.  Psychiatric:        Mood and Affect: Mood normal.        Behavior: Behavior normal.    NST:  Baseline: 130 bpm, Variability: Good {> 6 bpm), Accelerations: Reactive and Decelerations: Absent  Dilation: 1.5 Effacement (%): Thick Cervical Position: Posterior Station: Ballotable Exam by:: Jorje Guild NP  MAU Course  Procedures Results for orders placed or performed during the hospital encounter of 11/15/20 (from the past 24 hour(s))  Glucose, capillary     Status: None   Collection Time: 11/15/20 12:17 PM  Result Value Ref Range   Glucose-Capillary 84 70 - 99 mg/dL  Urinalysis, Routine w reflex microscopic Urine, Clean Catch     Status: Abnormal   Collection Time: 11/15/20  1:17 PM  Result Value Ref Range   Color, Urine AMBER (A) YELLOW   APPearance CLOUDY (A) CLEAR   Specific Gravity, Urine 1.020 1.005 - 1.030   pH 7.0 5.0 - 8.0   Glucose, UA NEGATIVE NEGATIVE mg/dL   Hgb urine dipstick NEGATIVE NEGATIVE   Bilirubin Urine NEGATIVE NEGATIVE   Ketones, ur NEGATIVE NEGATIVE mg/dL   Protein, ur NEGATIVE NEGATIVE mg/dL   Nitrite NEGATIVE NEGATIVE   Leukocytes,Ua TRACE (A) NEGATIVE   RBC / HPF 0-5 0 - 5 RBC/hpf   WBC, UA 0-5 0 - 5 WBC/hpf   Bacteria, UA RARE (A) NONE SEEN   Squamous Epithelial / LPF 0-5 0 - 5   Mucus PRESENT     MDM UI & irregular contractions on monitor. Patient does not appear uncomfortable & declines medication in MAU. Cervix 1-1.5/thick/posterior. Cervix unchanged after 1+ hour of monitoring. Reviewed reasons to return to MAU. Pt stable for discharge.   Patient states she's just been checking her blood sugars since last night. Had PP of 121 and fasting this morning was in the 70s. CBG 84 in MAU. Reviewed CBG parameters & encouraged patient to keep track of blood sugars for her ob.  Assessment and Plan   1. Braxton Hick's contraction    2. [redacted] weeks gestation of pregnancy   3. Diet controlled gestational diabetes mellitus (GDM) in third trimester    -reviewed reasons to return to [place] 11/15/2020, 3:15 PM

## 2020-11-16 ENCOUNTER — Ambulatory Visit: Payer: Medicaid Other

## 2020-11-16 ENCOUNTER — Telehealth: Payer: Self-pay

## 2020-11-16 LAB — CULTURE, OB URINE: Culture: <10000 — AB

## 2020-11-16 NOTE — Telephone Encounter (Signed)
Called to discuss with patient about COVID-19 symptoms and the use of one of the available treatments for those with mild to moderate Covid symptoms and at a high risk of hospitalization.  Pt appears to qualify for outpatient treatment due to co-morbid conditions and/or a member of an at-risk group in accordance with the FDA Emergency Use Authorization.    Pt. States she had COVID 19 in January. No symptoms today.  Felicia Harmon

## 2020-11-17 ENCOUNTER — Inpatient Hospital Stay (HOSPITAL_COMMUNITY)
Admission: AD | Admit: 2020-11-17 | Discharge: 2020-11-17 | Disposition: A | Discharge status: Home / Self Care | Payer: Medicaid Other | Attending: Obstetrics & Gynecology | Admitting: Obstetrics & Gynecology

## 2020-11-17 ENCOUNTER — Other Ambulatory Visit: Payer: Self-pay

## 2020-11-17 ENCOUNTER — Encounter: Payer: Self-pay | Admitting: Physical Therapy

## 2020-11-17 ENCOUNTER — Ambulatory Visit: Payer: Medicaid Other

## 2020-11-17 ENCOUNTER — Inpatient Hospital Stay (HOSPITAL_BASED_OUTPATIENT_CLINIC_OR_DEPARTMENT_OTHER): Payer: Medicaid Other

## 2020-11-17 ENCOUNTER — Encounter (HOSPITAL_COMMUNITY): Payer: Self-pay | Admitting: Obstetrics & Gynecology

## 2020-11-17 DIAGNOSIS — O26893 Other specified pregnancy related conditions, third trimester: Secondary | ICD-10-CM | POA: Diagnosis not present

## 2020-11-17 DIAGNOSIS — Z3689 Encounter for other specified antenatal screening: Secondary | ICD-10-CM

## 2020-11-17 DIAGNOSIS — Z3A36 36 weeks gestation of pregnancy: Secondary | ICD-10-CM

## 2020-11-17 DIAGNOSIS — O0933 Supervision of pregnancy with insufficient antenatal care, third trimester: Secondary | ICD-10-CM

## 2020-11-17 DIAGNOSIS — O24410 Gestational diabetes mellitus in pregnancy, diet controlled: Secondary | ICD-10-CM

## 2020-11-17 DIAGNOSIS — R109 Unspecified abdominal pain: Secondary | ICD-10-CM | POA: Diagnosis not present

## 2020-11-17 DIAGNOSIS — O24419 Gestational diabetes mellitus in pregnancy, unspecified control: Secondary | ICD-10-CM

## 2020-11-17 DIAGNOSIS — O368130 Decreased fetal movements, third trimester, not applicable or unspecified: Secondary | ICD-10-CM

## 2020-11-17 DIAGNOSIS — Z3480 Encounter for supervision of other normal pregnancy, unspecified trimester: Secondary | ICD-10-CM

## 2020-11-17 DIAGNOSIS — M545 Low back pain, unspecified: Secondary | ICD-10-CM

## 2020-11-17 LAB — WET PREP, GENITAL
Clue Cells Wet Prep HPF POC: NONE SEEN
Sperm: NONE SEEN
Trich, Wet Prep: NONE SEEN
Yeast Wet Prep HPF POC: NONE SEEN

## 2020-11-17 LAB — URINALYSIS, ROUTINE W REFLEX MICROSCOPIC
Bilirubin Urine: NEGATIVE
Glucose, UA: NEGATIVE mg/dL
Ketones, ur: NEGATIVE mg/dL
Nitrite: NEGATIVE
Protein, ur: NEGATIVE mg/dL
Specific Gravity, Urine: 1.013 (ref 1.005–1.030)
pH: 7 (ref 5.0–8.0)

## 2020-11-17 LAB — GLUCOSE, CAPILLARY: Glucose-Capillary: 90 mg/dL (ref 70–99)

## 2020-11-17 LAB — AMNISURE RUPTURE OF MEMBRANE (ROM) NOT AT ARMC: Amnisure ROM: NEGATIVE

## 2020-11-17 MED ORDER — CYCLOBENZAPRINE HCL 5 MG PO TABS
10.0000 mg | ORAL_TABLET | Freq: Once | ORAL | Status: AC
  Administered 2020-11-17: 10 mg via ORAL
  Filled 2020-11-17: qty 2

## 2020-11-17 MED ORDER — ACETAMINOPHEN 500 MG PO TABS
1000.0000 mg | ORAL_TABLET | Freq: Once | ORAL | Status: AC
  Administered 2020-11-17: 1000 mg via ORAL
  Filled 2020-11-17: qty 2

## 2020-11-17 NOTE — MAU Provider Note (Signed)
History    CSN: [id number]  Arrival date and time: 11/17/20 0932  Event Date/Time  First Provider Initiated Contact with Patient 11/17/20 1135     Chief Complaint  Patient presents with  . Decreased Fetal Movement  . Back Pain   HPI  Felicia Harmon is a 20 y.o. G3P2002 at [redacted]w[redacted]d who presents to MAU with chief complaint of decreased fetal movement. This is a new problem, onset this morning. Patient states she has only felt baby move once as of MAU sign-in.  Patient also c/o recurrent low back pain at her SI joint. She has been advised to manage her pain with Tylenol but did not experience the desired level of relief and discontinued use.  Patient's pregnancy is c/b A1GDM. She acknowledges that she does not routinely check her glucose. She endorses intermittent abnormal vaginal discharge "like water on my underwear". She states she was checked for ROM during an office visit but did not receive results.  Patient is intermittently tearful throughout her period of evaluation in MAU.  She states she is overwhelmed and scared for her baby and "wishes she could just be born". She declines SW consult but states she is more comfortable with Raelyn Mora, CNM who she is seeing for her next prenatal appointment on 11/19/2020.  OB History    Gravida  3   Para  2   Term  2   Preterm      AB      Living  2     SAB      IAB      Ectopic      Multiple  0   Live Births  2           Past Medical History:  Diagnosis Date  . Anemia   . Depression   . Gestational diabetes     Past Surgical History:  Procedure Laterality Date  . NO PAST SURGERIES      Family History  Problem Relation Age of Onset  . Healthy Mother   . Healthy Father     Social History   Tobacco Use  . Smoking status: Never Smoker  . Smokeless tobacco: Never Used  Vaping Use  . Vaping Use: Never used  Substance Use Topics  . Alcohol use: No  . Drug use: No    Allergies: No Known  Allergies  No medications prior to admission.    Review of Systems  Gastrointestinal: Positive for abdominal pain.  Genitourinary: Positive for vaginal discharge.  Musculoskeletal: Positive for back pain.  All other systems reviewed and are negative.  Physical Exam   Blood pressure 121/64, pulse 89, temperature 98.5 F (36.9 C), temperature source Oral, resp. rate 16, last menstrual period 02/06/2020, SpO2 100 %, unknown if currently breastfeeding.  Physical Exam Vitals and nursing note reviewed. Exam conducted with a chaperone present.  Constitutional:      Appearance: Normal appearance.  Cardiovascular:     Rate and Rhythm: Normal rate.     Pulses: Normal pulses.     Heart sounds: Normal heart sounds.  Pulmonary:     Effort: Pulmonary effort is normal.     Breath sounds: Normal breath sounds.  Abdominal:     Comments: Gravid  Skin:    Capillary Refill: Capillary refill takes less than 2 seconds.  Neurological:     Mental Status: She is alert and oriented to person, place, and time.  Psychiatric:        Mood and Affect:  Mood normal.        Behavior: Behavior normal.        Thought Content: Thought content normal.        Judgment: Judgment normal.     MAU Course  Procedures   --Reactive tracing after initiation of fluid bolus --Baseline 135, mod var, + 15 x 15 accels, no decels --Toco: irregular ctx q 2-5 min, resolving with fluid bolus  Orders Placed This Encounter  Procedures  . Wet prep, genital  . Culture, OB Urine  . [place] MFM Fetal BPP Wo Non Stress  . Urinalysis, Routine w reflex microscopic Urine, Clean Catch  . Amnisure rupture of membrane (rom)not at [facility]  . Glucose, capillary  . Nursing communication  . Discharge patient   Patient Vitals for the past 24 hrs:  BP Temp Temp src Pulse Resp SpO2  11/17/20 1146 121/64 98.5 F (36.9 C) Oral 89 16 100 %  11/17/20 0930 -- -- -- -- -- 100 %  11/17/20 0926 104/65 -- -- 100 -- --  11/17/20 0914 114/71  98.3 F (36.8 C) -- 97 16 99 %   Results for orders placed or performed during the hospital encounter of 11/17/20 (from the past 24 hour(s))  Glucose, capillary     Status: None   Collection Time: 11/17/20 10:24 AM  Result Value Ref Range   Glucose-Capillary 90 70 - 99 mg/dL  Wet prep, genital     Status: Abnormal   Collection Time: 11/17/20 10:26 AM   Specimen: PATH Cytology Cervicovaginal Ancillary Only  Result Value Ref Range   Yeast Wet Prep HPF POC NONE SEEN NONE SEEN   Trich, Wet Prep NONE SEEN NONE SEEN   Clue Cells Wet Prep HPF POC NONE SEEN NONE SEEN   WBC, Wet Prep HPF POC MANY (A) NONE SEEN   Sperm NONE SEEN   Amnisure rupture of membrane (rom)not at [facility]     Status: None   Collection Time: 11/17/20 10:26 AM  Result Value Ref Range   Amnisure ROM NEGATIVE   Urinalysis, Routine w reflex microscopic Urine, Clean Catch     Status: Abnormal   Collection Time: 11/17/20 11:51 AM  Result Value Ref Range   Color, Urine YELLOW YELLOW   APPearance CLOUDY (A) CLEAR   Specific Gravity, Urine 1.013 1.005 - 1.030   pH 7.0 5.0 - 8.0   Glucose, UA NEGATIVE NEGATIVE mg/dL   Hgb urine dipstick SMALL (A) NEGATIVE   Bilirubin Urine NEGATIVE NEGATIVE   Ketones, ur NEGATIVE NEGATIVE mg/dL   Protein, ur NEGATIVE NEGATIVE mg/dL   Nitrite NEGATIVE NEGATIVE   Leukocytes,Ua LARGE (A) NEGATIVE   RBC / HPF 6-10 0 - 5 RBC/hpf   WBC, UA 21-50 0 - 5 WBC/hpf   Bacteria, UA FEW (A) NONE SEEN   Squamous Epithelial / LPF 21-50 0 - 5   Mucus PRESENT    Hyaline Casts, UA PRESENT     Assessment and Plan  --20 y.o. [id number] at [redacted]w[redacted]d  --Reactive tracing --Intact amniotic sac --BPP 8/8 --SW consult declined by patient --Patient verbalizes feeling safe, supported, reassured about fetal status prior to discharge --Discharge home in stable condition  F/U: --Next appoint with [facility] is 11/19/2020 --Appointment for BPP tomorrow d/c by Briant Sites, CNM 11/17/2020, 2:42 PM

## 2020-11-17 NOTE — MAU Note (Signed)
Felicia Harmon is a 20 y.o. at [redacted]w[redacted]d here in MAU reporting: states she has only felt one movement today, around 0300. Is currently having back pain. No bleeding or LOF.  Onset of complaint: today  Pain score: 7/10  Vitals:   11/17/20 0914  BP: 114/71  Pulse: 97  Resp: 16  Temp: 98.3 F (36.8 C)  SpO2: 99%     FHT:153  Lab orders placed from triage: none

## 2020-11-17 NOTE — Discharge Instructions (Signed)
Fetal Movement Counts Patient Name: ________________________________________________ Patient Due Date: ____________________  What is a fetal movement count? A fetal movement count is the number of times that you feel your baby move during a certain amount of time. This may also be called a fetal kick count. A fetal movement count is recommended for every pregnant woman. You may be asked to start counting fetal movements as early as week 28 of your pregnancy. Pay attention to when your baby is most active. You may notice your baby's sleep and wake cycles. You may also notice things that make your baby move more. You should do a fetal movement count:  When your baby is normally most active.  At the same time each day. A good time to count movements is while you are resting, after having something to eat and drink. How do I count fetal movements? 1. Find a quiet, comfortable area. Sit, or lie down on your side. 2. Write down the date, the start time and stop time, and the number of movements that you felt between those two times. Take this information with you to your health care visits. 3. Write down your start time when you feel the first movement. 4. Count kicks, flutters, swishes, rolls, and jabs. You should feel at least 10 movements. 5. You may stop counting after you have felt 10 movements, or if you have been counting for 2 hours. Write down the stop time. 6. If you do not feel 10 movements in 2 hours, contact your health care provider for further instructions. Your health care provider may want to do additional tests to assess your baby's well-being. Contact a health care provider if:  You feel fewer than 10 movements in 2 hours.  Your baby is not moving like he or she usually does. Date: ____________ Start time: ____________ Stop time: ____________ Movements: ____________ Date: ____________ Start time: ____________ Stop time: ____________ Movements: ____________ Date: ____________  Start time: ____________ Stop time: ____________ Movements: ____________ Date: ____________ Start time: ____________ Stop time: ____________ Movements: ____________ Date: ____________ Start time: ____________ Stop time: ____________ Movements: ____________ Date: ____________ Start time: ____________ Stop time: ____________ Movements: ____________ Date: ____________ Start time: ____________ Stop time: ____________ Movements: ____________ Date: ____________ Start time: ____________ Stop time: ____________ Movements: ____________ Date: ____________ Start time: ____________ Stop time: ____________ Movements: ____________ This information is not intended to replace advice given to you by your health care provider. Make sure you discuss any questions you have with your health care provider. Document Revised: 03/14/2019 Document Reviewed: 03/14/2019 Elsevier Patient Education  2021 Elsevier Inc. Gestational Diabetes Mellitus, Diagnosis Gestational diabetes mellitus is a form of diabetes. It can happen when you are pregnant. The diabetes goes away after you give birth. If you do not get treated for this condition, it may cause problems for you or your baby. What are the causes? This condition is caused by changes in your body when you are pregnant. When these happen:  A part of the body called the pancreas does not make enough insulin.  The body cannot use insulin in the right way. Sugars cannot get into cells in your body. The sugars stay in your blood. This leads to high blood sugar.   What increases the risk?  Being older than age 25 when pregnant.  Having someone with diabetes in your family.  Too much body weight.  Having had this condition in the past.  Polycystic ovary syndrome.  Being pregnant with more than one baby. What are   the signs or symptoms?  Being thirsty often.  Being hungry often.  Needing to pee more often. How is this treated?  Eat a healthy diet.  Get more  exercise.  Check your blood sugar often.  Take insulin and other medicines, if needed.  Work with an expert on this condition, if told. Follow these instructions at home: Learn about your diabetes Ask your doctor:  How often should I check my blood sugar? Where do I get the equipment?  What medicines do I need? When should I take them?  Do I need to meet with an educator?  Who can I call if I have questions?  Where can I find a support group? General instructions  Take medicines only as told by your doctor.  Stay at a healthy weight.  Drink enough fluid to keep your pee pale yellow.  Wear an alert bracelet or carry a card that shows you have this condition.  Keep all follow-up visits. Where to find more information  American Diabetes Association (ADA): diabetes.org  Association of Diabetes Care & Education Specialists (ADCES): diabeteseducator.org  Centers for Disease Control and Prevention (CDC): cdc.gov  American Pregnancy Association: americanpregnancy.org  [place] Department of Agriculture MyPlate: myplate.gov Contact a doctor if:  Your blood sugar is at or above 240 mg/dL (13.3 mmol/L).  Your blood sugar is at or above 200 mg/dL (11.1 mmol/L) and you have ketones in your pee.  You have a fever.  You are sick for 2 days or more and you do not get better.  You have either of these problems for more than 6 hours: ? You vomit every time you eat or drink. ? You have watery poop (diarrhea). Get help right away if:  You cannot think clearly.  You are not breathing well.  You have a lot of ketones in your pee.  Your baby seems to move less than normal.  Abnormal fluid or blood starts to come out of your vagina.  You start having contractions before your due date. You may feel your belly tighten.  You have a very bad headache. These symptoms may be an emergency. Get help right away. Call your local emergency services (911 in the [place]  Do not wait to  see if the symptoms will go away.  Do not drive yourself to the hospital. Summary  Gestational diabetes is a form of diabetes. It can happen when you are pregnant.  This condition occurs when your body cannot make or use insulin in the right way.  Eat a healthy diet, exercise, and use medicines or insulin as told by your doctor.  Tell your doctor if your blood sugar is high, you have a fever, or you vomit every time you eat or drink.  Get help right away if you cannot think clearly, you are not breathing well, or your baby seems to move less than normal. This information is not intended to replace advice given to you by your health care provider. Make sure you discuss any questions you have with your health care provider. Document Revised: 12/30/2019 Document Reviewed: 12/30/2019 Elsevier Patient Education  2021 Elsevier Inc.  

## 2020-11-18 ENCOUNTER — Ambulatory Visit: Payer: Medicaid Other

## 2020-11-18 LAB — GC/CHLAMYDIA PROBE AMP (~~LOC~~) NOT AT ARMC
Chlamydia: NEGATIVE
Comment: NEGATIVE
Comment: NORMAL
Neisseria Gonorrhea: NEGATIVE

## 2020-11-18 LAB — CULTURE, OB URINE

## 2020-11-19 ENCOUNTER — Other Ambulatory Visit: Payer: Self-pay

## 2020-11-19 ENCOUNTER — Ambulatory Visit (INDEPENDENT_AMBULATORY_CARE_PROVIDER_SITE_OTHER): Payer: Medicaid Other | Admitting: Obstetrics and Gynecology

## 2020-11-19 ENCOUNTER — Encounter: Payer: Self-pay | Admitting: Obstetrics and Gynecology

## 2020-11-19 VITALS — BP 125/82 | HR 98 | Wt 252.2 lb

## 2020-11-19 DIAGNOSIS — Z3480 Encounter for supervision of other normal pregnancy, unspecified trimester: Secondary | ICD-10-CM

## 2020-11-19 DIAGNOSIS — O24410 Gestational diabetes mellitus in pregnancy, diet controlled: Secondary | ICD-10-CM | POA: Diagnosis not present

## 2020-11-19 DIAGNOSIS — Z3A36 36 weeks gestation of pregnancy: Secondary | ICD-10-CM

## 2020-11-19 LAB — GLUCOSE, POCT (MANUAL RESULT ENTRY): POC Glucose: 91 mg/dl (ref 70–99)

## 2020-11-19 NOTE — Progress Notes (Addendum)
   LOW-RISK PREGNANCY OFFICE VISIT Patient name: Felicia Harmon MRN [id number]  Date of birth: 28-Dec-2000 Chief Complaint:   Routine Prenatal Visit  History of Present Illness:   Felicia Harmon is a 20 y.o. G13P2002 female at [redacted]w[redacted]d with an Estimated Date of Delivery: 12/11/20 being seen today for ongoing management of a low-risk pregnancy.  Today she reports backache, occasional contractions and wanting to be put in labor because she has so much going on in her life that she can't take being pregnant anymore. She is not checking her blood sugars, because "not enough time and forgets." Contractions: Irritability. Vag. Bleeding: None.  Movement: Present. denies leaking of fluid. Review of Systems:   Pertinent items are noted in HPI Denies abnormal vaginal discharge w/ itching/odor/irritation, headaches, visual changes, shortness of breath, chest pain, abdominal pain, severe nausea/vomiting, or problems with urination or bowel movements unless otherwise stated above. Pertinent History Reviewed:  Reviewed past medical,surgical, social, obstetrical and family history.  Reviewed problem list, medications and allergies. Physical Assessment:   Vitals:   11/19/20 1334  BP: 125/82  Pulse: 98  Weight: 252 lb 3.2 oz (114.4 kg)  Body mass index is 37.79 kg/m.        Physical Examination:   General appearance: Well appearing, and in no distress  Mental status: Alert, oriented to person, place, and time  Skin: Warm & dry  Cardiovascular: Normal heart rate noted  Respiratory: Normal respiratory effort, no distress  Abdomen: Soft, gravid, nontender  Pelvic: Cervical exam performed         Extremities: Edema: None  Fetal Status: Fetal Heart Rate (bpm): 142   Movement: Present    Results for orders placed or performed in visit on 11/19/20 (from the past 24 hour(s))  POCT Glucose (CBG)   Collection Time: 11/19/20  1:49 PM  Result Value Ref Range   POC Glucose 91 70 - 99 mg/dl    Assessment & Plan:   1) Low-risk pregnancy G3P2002 at [redacted]w[redacted]d with an Estimated Date of Delivery: 12/11/20   2) Supervision of other normal pregnancy, antepartum  - Culture, beta strep (group b only)  3) Diet controlled gestational diabetes mellitus (GDM) in third trimester  - Advised to check BS 4 times a day - POCT Glucose (CBG) - Advised that can be induced at 39-40 weeks for GDM - Plan to schedule IOL at nv for 39-40 weeks  4) [redacted] weeks gestation of pregnancy    Meds: No orders of the defined types were placed in this encounter.  Labs/procedures today: GBS, cervical exam  Plan:  Continue routine obstetrical care   Reviewed: Preterm labor symptoms and general obstetric precautions including but not limited to vaginal bleeding, contractions, leaking of fluid and fetal movement were reviewed in detail with the patient.  All questions were answered. Has home bp cuff. Check bp weekly, let us know if >140/90.   Follow-up: Return in about 1 week (around 11/26/2020) for Return OB visit.  Orders Placed This Encounter  Procedures  . Culture, beta strep (group b only)  . POCT Glucose (CBG)   Raelyn Mora MSN, CNM 11/19/2020 1:38 PM

## 2020-11-22 ENCOUNTER — Inpatient Hospital Stay (HOSPITAL_COMMUNITY)
Admission: AD | Admit: 2020-11-22 | Discharge: 2020-11-22 | Disposition: A | Discharge status: Home / Self Care | Payer: Medicaid Other | Attending: Obstetrics & Gynecology | Admitting: Obstetrics & Gynecology

## 2020-11-22 ENCOUNTER — Other Ambulatory Visit: Payer: Self-pay

## 2020-11-22 ENCOUNTER — Encounter (HOSPITAL_COMMUNITY): Payer: Self-pay | Admitting: Obstetrics & Gynecology

## 2020-11-22 DIAGNOSIS — O23593 Infection of other part of genital tract in pregnancy, third trimester: Secondary | ICD-10-CM

## 2020-11-22 DIAGNOSIS — B373 Candidiasis of vulva and vagina: Secondary | ICD-10-CM | POA: Insufficient documentation

## 2020-11-22 DIAGNOSIS — O98813 Other maternal infectious and parasitic diseases complicating pregnancy, third trimester: Secondary | ICD-10-CM | POA: Diagnosis not present

## 2020-11-22 DIAGNOSIS — Z3A00 Weeks of gestation of pregnancy not specified: Secondary | ICD-10-CM

## 2020-11-22 DIAGNOSIS — O471 False labor at or after 37 completed weeks of gestation: Secondary | ICD-10-CM | POA: Insufficient documentation

## 2020-11-22 DIAGNOSIS — O479 False labor, unspecified: Secondary | ICD-10-CM | POA: Diagnosis not present

## 2020-11-22 DIAGNOSIS — B3731 Acute candidiasis of vulva and vagina: Secondary | ICD-10-CM

## 2020-11-22 DIAGNOSIS — Z3A37 37 weeks gestation of pregnancy: Secondary | ICD-10-CM | POA: Diagnosis not present

## 2020-11-22 LAB — WET PREP, GENITAL
Clue Cells Wet Prep HPF POC: NONE SEEN
Sperm: NONE SEEN
Trich, Wet Prep: NONE SEEN

## 2020-11-22 LAB — POCT FERN TEST: POCT Fern Test: NEGATIVE

## 2020-11-22 MED ORDER — TERCONAZOLE 0.4 % VA CREA: 1.0000 | TOPICAL_CREAM | Freq: Every day | VAGINAL | 0 refills | Status: DC

## 2020-11-22 NOTE — Discharge Instructions (Signed)
[employer]'s Emergency Medicine: Concepts and Clinical Practice (9th ed., pp. 2296- 2312). Elsevier.">  Braxton Hicks Contractions Contractions of the uterus can occur throughout pregnancy, but they are not always a sign that you are in labor. You may have practice contractions called Braxton Hicks contractions. These false labor contractions are sometimes confused with true labor. What are Braxton Hicks contractions? Braxton Hicks contractions are tightening movements that occur in the muscles of the uterus before labor. Unlike true labor contractions, these contractions do not result in opening (dilation) and thinning of the cervix. Toward the end of pregnancy (32-34 weeks), Braxton Hicks contractions can happen more often and may become stronger. These contractions are sometimes difficult to tell apart from true labor because they can be very uncomfortable. You should not feel embarrassed if you go to the hospital with false labor. Sometimes, the only way to tell if you are in true labor is for your health care provider to look for changes in the cervix. The health care provider will do a physical exam and may monitor your contractions. If you are not in true labor, the exam should show that your cervix is not dilating and your water has not broken. If there are no other health problems associated with your pregnancy, it is completely safe for you to be sent home with false labor. You may continue to have Braxton Hicks contractions until you go into true labor. How to tell the difference between true labor and false labor True labor  Contractions last 30-70 seconds.  Contractions become very regular.  Discomfort is usually felt in the top of the uterus, and it spreads to the lower abdomen and low back.  Contractions do not go away with walking.  Contractions usually become more intense and increase in frequency.  The cervix dilates and gets thinner. False labor  Contractions are usually shorter  and not as strong as true labor contractions.  Contractions are usually irregular.  Contractions are often felt in the front of the lower abdomen and in the groin.  Contractions may go away when you walk around or change positions while lying down.  Contractions get weaker and are shorter-lasting as time goes on.  The cervix usually does not dilate or become thin. Follow these instructions at home:  Take over-the-counter and prescription medicines only as told by your health care provider.  Keep up with your usual exercises and follow other instructions from your health care provider.  Eat and drink lightly if you think you are going into labor.  If Braxton Hicks contractions are making you uncomfortable: ? Change your position from lying down or resting to walking, or change from walking to resting. ? Sit and rest in a tub of warm water. ? Drink enough fluid to keep your urine pale yellow. Dehydration may cause these contractions. ? Do slow and deep breathing several times an hour.  Keep all follow-up prenatal visits as told by your health care provider. This is important.   Contact a health care provider if:  You have a fever.  You have continuous pain in your abdomen. Get help right away if:  Your contractions become stronger, more regular, and closer together.  You have fluid leaking or gushing from your vagina.  You pass blood-tinged mucus (bloody show).  You have bleeding from your vagina.  You have low back pain that you never had before.  You feel your baby's head pushing down and causing pelvic pressure.  Your baby is not moving inside   you as much as it used to. Summary  Contractions that occur before labor are called Braxton Hicks contractions, false labor, or practice contractions.  Braxton Hicks contractions are usually shorter, weaker, farther apart, and less regular than true labor contractions. True labor contractions usually become progressively  stronger and regular, and they become more frequent.  Manage discomfort from [facility] contractions by changing position, resting in a warm bath, drinking plenty of water, or practicing deep breathing. This information is not intended to replace advice given to you by your health care provider. Make sure you discuss any questions you have with your health care provider. Document Revised: 07/07/2017 Document Reviewed: 12/08/2016 Elsevier Patient Education  2021 [employer] Inc.   Vaginal Yeast Infection, Adult  Vaginal yeast infection is a condition that causes vaginal discharge as well as soreness, swelling, and redness (inflammation) of the vagina. This is a common condition. Some women get this infection frequently. What are the causes? This condition is caused by a change in the normal balance of the yeast (candida) and bacteria that live in the vagina. This change causes an overgrowth of yeast, which causes the inflammation. What increases the risk? The condition is more likely to develop in women who:  Take antibiotic medicines.  Have diabetes.  Take birth control pills.  Are pregnant.  Douche often.  Have a weak body defense system (immune system).  Have been taking steroid medicines for a long time.  Frequently wear tight clothing. What are the signs or symptoms? Symptoms of this condition include:  White, thick, creamy vaginal discharge.  Swelling, itching, redness, and irritation of the vagina. The lips of the vagina (vulva) may be affected as well.  Pain or a burning feeling while urinating.  Pain during sex. How is this diagnosed? This condition is diagnosed based on:  Your medical history.  A physical exam.  A pelvic exam. Your health care provider will examine a sample of your vaginal discharge under a microscope. Your health care provider may send this sample for testing to confirm the diagnosis. How is this treated? This condition is treated with  medicine. Medicines may be over-the-counter or prescription. You may be told to use one or more of the following:  Medicine that is taken by mouth (orally).  Medicine that is applied as a cream (topically).  Medicine that is inserted directly into the vagina (suppository). Follow these instructions at home: Lifestyle  Do not have sex until your health care provider approves. Tell your sex partner that you have a yeast infection. That person should go to his or her health care provider and ask if they should also be treated.  Do not wear tight clothes, such as pantyhose or tight pants.  Wear breathable cotton underwear. General instructions  Take or apply over-the-counter and prescription medicines only as told by your health care provider.  Eat more yogurt. This may help to keep your yeast infection from returning.  Do not use tampons until your health care provider approves.  Try taking a sitz bath to help with discomfort. This is a warm water bath that is taken while you are sitting down. The water should only come up to your hips and should cover your buttocks. Do this 3-4 times per day or as told by your health care provider.  Do not douche.  If you have diabetes, keep your blood sugar levels under control.  Keep all follow-up visits as told by your health care provider. This is important.  Contact a health care provider if:  You have a fever.  Your symptoms go away and then return.  Your symptoms do not get better with treatment.  Your symptoms get worse.  You have new symptoms.  You develop blisters in or around your vagina.  You have blood coming from your vagina and it is not your menstrual period.  You develop pain in your abdomen. Summary  Vaginal yeast infection is a condition that causes discharge as well as soreness, swelling, and redness (inflammation) of the vagina.  This condition is treated with medicine. Medicines may be over-the-counter or  prescription.  Take or apply over-the-counter and prescription medicines only as told by your health care provider.  Do not douche. Do not have sex or use tampons until your health care provider approves.  Contact a health care provider if your symptoms do not get better with treatment or your symptoms go away and then return. This information is not intended to replace advice given to you by your health care provider. Make sure you discuss any questions you have with your health care provider. Document Revised: 02/22/2019 Document Reviewed: 12/11/2017 Elsevier Patient Education  2021 [employer].

## 2020-11-22 NOTE — MAU Note (Signed)
Pt reports she went on an hour long walk yesterday. Pt reports ctx's that started this morning.   Pt reports grocery shopping today. Pt reports pain in her butt that comes and goes patient states she checked herself for a hemorrhoid and didn't see one.   Pt reports constant back pain for the last 3 days.   Denies vaginal bleeding, reports clear vaginal discharge later in the pregnancy unsure if it is her water, but does not think so. Pt states she is very busy and does not have time to check.

## 2020-11-22 NOTE — MAU Provider Note (Addendum)
  S Felicia Harmon is a 20 y.o. [id number] patient who presents to MAU today with complaint of contractions and leaking clear fluid since around 1pm. She denies vaginal bleeding. Endorses active fetal movement.   O LMP 02/06/2020 (Approximate)  Physical Exam Cardiovascular:     Rate and Rhythm: Normal rate.  Pulmonary:     Effort: Pulmonary effort is normal.  Abdominal:     Palpations: Abdomen is soft.     Comments: Gravid   Genitourinary:    Vagina: Vaginal discharge present.     Comments: Copious amounts of yeast present along vaginal sidewalls and cervix. No pooling of amniotic fluid, vaginal bleeding.  Cervix 1.5/50/-3, BBOW, remains unchanged after 1 hour  Neurological:     General: No focal deficit present.     Mental Status: She is alert and oriented to person, place, and time.  Psychiatric:        Mood and Affect: Mood normal.    A Medical screening exam complete Braxton hicks contractions Vaginal yeast infection Reactive NST   P Discharge from MAU in stable condition Terazol cream rx sent to pharmacy Warning signs for worsening condition that would warrant emergency follow-up discussed Patient may return to MAU as needed   Camelia Eng, CNM 11/22/2020 7:04 PM

## 2020-11-23 ENCOUNTER — Encounter: Payer: Self-pay | Admitting: *Deleted

## 2020-11-23 ENCOUNTER — Ambulatory Visit (HOSPITAL_BASED_OUTPATIENT_CLINIC_OR_DEPARTMENT_OTHER): Payer: Medicaid Other | Admitting: *Deleted

## 2020-11-23 ENCOUNTER — Ambulatory Visit (HOSPITAL_BASED_OUTPATIENT_CLINIC_OR_DEPARTMENT_OTHER): Payer: Medicaid Other

## 2020-11-23 DIAGNOSIS — O368130 Decreased fetal movements, third trimester, not applicable or unspecified: Secondary | ICD-10-CM

## 2020-11-23 DIAGNOSIS — Z3480 Encounter for supervision of other normal pregnancy, unspecified trimester: Secondary | ICD-10-CM | POA: Insufficient documentation

## 2020-11-23 DIAGNOSIS — Z3A37 37 weeks gestation of pregnancy: Secondary | ICD-10-CM | POA: Insufficient documentation

## 2020-11-23 DIAGNOSIS — O24410 Gestational diabetes mellitus in pregnancy, diet controlled: Secondary | ICD-10-CM

## 2020-11-23 DIAGNOSIS — O0933 Supervision of pregnancy with insufficient antenatal care, third trimester: Secondary | ICD-10-CM | POA: Diagnosis not present

## 2020-11-23 DIAGNOSIS — O99213 Obesity complicating pregnancy, third trimester: Secondary | ICD-10-CM | POA: Insufficient documentation

## 2020-11-23 LAB — CULTURE, BETA STREP (GROUP B ONLY): Strep Gp B Culture: NEGATIVE

## 2020-11-23 NOTE — Progress Notes (Signed)
MFM Note  This patient was seen for a biophysical profile due to diet-controlled gestational diabetes.  The patient reports that she has presented to the MAU three times over the past week due to various issues including decreased fetal movement and lower abdominal pain/cramping.  She reports that her cervix is already 1 to 2 cm dilated.  This is her third baby.  The patient was extremely upset today as she feels that her complaints are being ignored by the people in the hospital.  She reports that she is no longer checking her fingerstick values.  The EFW obtained 2 weeks ago was 6 pounds 12 ounces (86 percentile).  A biophysical profile performed today was 8 out of 8.    Polyhydramnios the total AFI of over 27 cm was noted today.  The patient was advised that I am concerned that she is no longer checking her fingerstick values.  Due to the polyhydramnios noted today, there is a high probability that her gestational diabetes is no longer in good control.  At her current gestational age (37 weeks and 3 days), I would recommend delivery at this time due to uncontrolled gestational diabetes.    I called the first attending in the hospital who will schedule her induction for her as soon as possible.  A total of 15 minutes was spent counseling and coordinating the care for this patient.  Greater than 50% of the time was spent in direct face-to-face contact.

## 2020-11-23 NOTE — Progress Notes (Signed)
Patient upset and crying. States she feels like she cannot make it much longer. C/O pain in her back and irreg UC's. States sometimes she can barely move. States she is working and taking care of her kids and FOB. Feels that she is doing all this alone. States she has a hx of depression and does not want to "fall back into that." Offer to contact counselor. Patient states no one can do anything to help.

## 2020-11-24 ENCOUNTER — Encounter (HOSPITAL_COMMUNITY): Payer: Self-pay | Admitting: Family Medicine

## 2020-11-24 ENCOUNTER — Other Ambulatory Visit: Payer: Self-pay

## 2020-11-24 ENCOUNTER — Inpatient Hospital Stay (HOSPITAL_COMMUNITY): Payer: Medicaid Other

## 2020-11-24 ENCOUNTER — Inpatient Hospital Stay (HOSPITAL_COMMUNITY)
Admission: AD | Admit: 2020-11-24 | Discharge: 2020-11-26 | DRG: 807 | Disposition: A | Discharge status: Home / Self Care | Payer: Medicaid Other | Attending: Family Medicine | Admitting: Family Medicine

## 2020-11-24 ENCOUNTER — Encounter: Payer: Self-pay | Admitting: Physical Therapy

## 2020-11-24 ENCOUNTER — Inpatient Hospital Stay (HOSPITAL_COMMUNITY): Payer: Medicaid Other | Admitting: Anesthesiology

## 2020-11-24 DIAGNOSIS — O326XX0 Maternal care for compound presentation, not applicable or unspecified: Secondary | ICD-10-CM | POA: Diagnosis not present

## 2020-11-24 DIAGNOSIS — O24410 Gestational diabetes mellitus in pregnancy, diet controlled: Secondary | ICD-10-CM

## 2020-11-24 DIAGNOSIS — Z20822 Contact with and (suspected) exposure to covid-19: Secondary | ICD-10-CM | POA: Diagnosis present

## 2020-11-24 DIAGNOSIS — Z3A37 37 weeks gestation of pregnancy: Secondary | ICD-10-CM

## 2020-11-24 DIAGNOSIS — O403XX0 Polyhydramnios, third trimester, not applicable or unspecified: Secondary | ICD-10-CM | POA: Diagnosis present

## 2020-11-24 DIAGNOSIS — D509 Iron deficiency anemia, unspecified: Secondary | ICD-10-CM | POA: Diagnosis present

## 2020-11-24 DIAGNOSIS — Z3480 Encounter for supervision of other normal pregnancy, unspecified trimester: Secondary | ICD-10-CM

## 2020-11-24 DIAGNOSIS — Z8616 Personal history of COVID-19: Secondary | ICD-10-CM

## 2020-11-24 DIAGNOSIS — Z30017 Encounter for initial prescription of implantable subdermal contraceptive: Secondary | ICD-10-CM | POA: Diagnosis not present

## 2020-11-24 DIAGNOSIS — O24420 Gestational diabetes mellitus in childbirth, diet controlled: Secondary | ICD-10-CM | POA: Diagnosis present

## 2020-11-24 DIAGNOSIS — O9902 Anemia complicating childbirth: Secondary | ICD-10-CM | POA: Diagnosis present

## 2020-11-24 DIAGNOSIS — Z3490 Encounter for supervision of normal pregnancy, unspecified, unspecified trimester: Secondary | ICD-10-CM | POA: Diagnosis present

## 2020-11-24 LAB — CBC
HCT: 28.8 % — ABNORMAL LOW (ref 36.0–46.0)
Hemoglobin: 8.8 g/dL — ABNORMAL LOW (ref 12.0–15.0)
MCH: 24 pg — ABNORMAL LOW (ref 26.0–34.0)
MCHC: 30.6 g/dL (ref 30.0–36.0)
MCV: 78.5 fL — ABNORMAL LOW (ref 80.0–100.0)
Platelets: 289 10*3/uL (ref 150–400)
RBC: 3.67 MIL/uL — ABNORMAL LOW (ref 3.87–5.11)
RDW: 19.3 % — ABNORMAL HIGH (ref 11.5–15.5)
WBC: 7.4 10*3/uL (ref 4.0–10.5)
nRBC: 0 % (ref 0.0–0.2)

## 2020-11-24 LAB — TYPE AND SCREEN
ABO/RH(D): A POS
Antibody Screen: NEGATIVE

## 2020-11-24 LAB — RESP PANEL BY RT-PCR (FLU A&B, COVID) ARPGX2
Influenza A by PCR: NEGATIVE
Influenza B by PCR: NEGATIVE
SARS Coronavirus 2 by RT PCR: NEGATIVE

## 2020-11-24 LAB — GLUCOSE, CAPILLARY
Glucose-Capillary: 94 mg/dL (ref 70–99)
Glucose-Capillary: 89 mg/dL (ref 70–99)

## 2020-11-24 LAB — RPR: RPR Ser Ql: NONREACTIVE

## 2020-11-24 MED ORDER — ONDANSETRON HCL 4 MG/2ML IJ SOLN: 4.0000 mg | Freq: Four times a day (QID) | INTRAMUSCULAR | Status: DC | PRN

## 2020-11-24 MED ORDER — ONDANSETRON HCL 4 MG PO TABS: 4.0000 mg | ORAL_TABLET | ORAL | Status: DC | PRN

## 2020-11-24 MED ORDER — LACTATED RINGERS IV SOLN: INTRAVENOUS | Status: DC

## 2020-11-24 MED ORDER — SENNOSIDES-DOCUSATE SODIUM 8.6-50 MG PO TABS
2.0000 | ORAL_TABLET | ORAL | Status: DC
  Administered 2020-11-25 – 2020-11-26 (×2): 2 via ORAL
  Filled 2020-11-24 (×2): qty 2

## 2020-11-24 MED ORDER — IBUPROFEN 600 MG PO TABS
600.0000 mg | ORAL_TABLET | Freq: Four times a day (QID) | ORAL | Status: DC
Start: 2020-11-24 — End: 2020-11-26
  Administered 2020-11-24 – 2020-11-26 (×8): 600 mg via ORAL
  Filled 2020-11-24 (×8): qty 1

## 2020-11-24 MED ORDER — ONDANSETRON HCL 4 MG/2ML IJ SOLN
4.0000 mg | INTRAMUSCULAR | Status: DC | PRN
Start: 2020-11-24 — End: 2020-11-26

## 2020-11-24 MED ORDER — SOD CITRATE-CITRIC ACID 500-334 MG/5ML PO SOLN: 30.0000 mL | ORAL | Status: DC | PRN

## 2020-11-24 MED ORDER — PHENYLEPHRINE 40 MCG/ML (10ML) SYRINGE FOR IV PUSH (FOR BLOOD PRESSURE SUPPORT): 80.0000 ug | PREFILLED_SYRINGE | INTRAVENOUS | Status: DC | PRN

## 2020-11-24 MED ORDER — DIPHENHYDRAMINE HCL 50 MG/ML IJ SOLN: 12.5000 mg | INTRAMUSCULAR | Status: DC | PRN

## 2020-11-24 MED ORDER — LIDOCAINE HCL (PF) 1 % IJ SOLN: 30.0000 mL | INTRAMUSCULAR | Status: DC | PRN

## 2020-11-24 MED ORDER — PHENYLEPHRINE 40 MCG/ML (10ML) SYRINGE FOR IV PUSH (FOR BLOOD PRESSURE SUPPORT)
80.0000 ug | PREFILLED_SYRINGE | INTRAVENOUS | Status: DC | PRN
  Filled 2020-11-24: qty 10

## 2020-11-24 MED ORDER — COCONUT OIL OIL: 1.0000 "application " | TOPICAL_OIL | Status: DC | PRN

## 2020-11-24 MED ORDER — DIPHENHYDRAMINE HCL 25 MG PO CAPS: 25.0000 mg | ORAL_CAPSULE | Freq: Four times a day (QID) | ORAL | Status: DC | PRN

## 2020-11-24 MED ORDER — OXYTOCIN-SODIUM CHLORIDE 30-0.9 UT/500ML-% IV SOLN
1.0000 m[IU]/min | INTRAVENOUS | Status: DC
  Administered 2020-11-24: 2 m[IU]/min via INTRAVENOUS

## 2020-11-24 MED ORDER — LACTATED RINGERS IV SOLN: 500.0000 mL | INTRAVENOUS | Status: DC | PRN

## 2020-11-24 MED ORDER — TETANUS-DIPHTH-ACELL PERTUSSIS 5-2.5-18.5 LF-MCG/0.5 IM SUSY: 0.5000 mL | PREFILLED_SYRINGE | Freq: Once | INTRAMUSCULAR | Status: DC

## 2020-11-24 MED ORDER — WITCH HAZEL-GLYCERIN EX PADS: 1.0000 "application " | MEDICATED_PAD | CUTANEOUS | Status: DC | PRN

## 2020-11-24 MED ORDER — OXYCODONE HCL 5 MG PO TABS
5.0000 mg | ORAL_TABLET | Freq: Once | ORAL | Status: AC
Start: 2020-11-24 — End: 2020-11-24
  Administered 2020-11-24: 5 mg via ORAL
  Filled 2020-11-24: qty 1

## 2020-11-24 MED ORDER — FENTANYL-BUPIVACAINE-NACL 0.5-0.125-0.9 MG/250ML-% EP SOLN
EPIDURAL | Status: AC
  Filled 2020-11-24: qty 250

## 2020-11-24 MED ORDER — EPHEDRINE 5 MG/ML INJ: 10.0000 mg | INTRAVENOUS | Status: DC | PRN

## 2020-11-24 MED ORDER — OXYTOCIN BOLUS FROM INFUSION
333.0000 mL | Freq: Once | INTRAVENOUS | Status: AC
  Administered 2020-11-24: 333 mL via INTRAVENOUS

## 2020-11-24 MED ORDER — MEASLES, MUMPS & RUBELLA VAC IJ SOLR: 0.5000 mL | Freq: Once | INTRAMUSCULAR | Status: DC

## 2020-11-24 MED ORDER — FENTANYL-BUPIVACAINE-NACL 0.5-0.125-0.9 MG/250ML-% EP SOLN
12.0000 mL/h | EPIDURAL | Status: DC | PRN
  Administered 2020-11-24: 12 mL/h via EPIDURAL

## 2020-11-24 MED ORDER — ACETAMINOPHEN 325 MG PO TABS
650.0000 mg | ORAL_TABLET | ORAL | Status: DC | PRN
  Administered 2020-11-24: 650 mg via ORAL
  Filled 2020-11-24: qty 2

## 2020-11-24 MED ORDER — PRENATAL MULTIVITAMIN CH
1.0000 | ORAL_TABLET | Freq: Every day | ORAL | Status: DC
  Administered 2020-11-25: 1 via ORAL
  Filled 2020-11-24: qty 1

## 2020-11-24 MED ORDER — ZOLPIDEM TARTRATE 5 MG PO TABS
5.0000 mg | ORAL_TABLET | Freq: Every evening | ORAL | Status: DC | PRN
Start: 2020-11-24 — End: 2020-11-26

## 2020-11-24 MED ORDER — LACTATED RINGERS IV SOLN: 500.0000 mL | Freq: Once | INTRAVENOUS | Status: DC

## 2020-11-24 MED ORDER — BENZOCAINE-MENTHOL 20-0.5 % EX AERO: 1.0000 "application " | INHALATION_SPRAY | CUTANEOUS | Status: DC | PRN

## 2020-11-24 MED ORDER — ACETAMINOPHEN 325 MG PO TABS
650.0000 mg | ORAL_TABLET | Freq: Four times a day (QID) | ORAL | Status: DC
  Administered 2020-11-25 – 2020-11-26 (×6): 650 mg via ORAL
  Filled 2020-11-24 (×6): qty 2

## 2020-11-24 MED ORDER — FENTANYL CITRATE (PF) 100 MCG/2ML IJ SOLN: 50.0000 ug | INTRAMUSCULAR | Status: DC | PRN

## 2020-11-24 MED ORDER — ACETAMINOPHEN 325 MG PO TABS: 650.0000 mg | ORAL_TABLET | ORAL | Status: DC | PRN

## 2020-11-24 MED ORDER — OXYTOCIN-SODIUM CHLORIDE 30-0.9 UT/500ML-% IV SOLN
2.5000 [IU]/h | INTRAVENOUS | Status: DC
  Administered 2020-11-24: 2.5 [IU]/h via INTRAVENOUS
  Filled 2020-11-24: qty 500

## 2020-11-24 MED ORDER — LIDOCAINE HCL (PF) 1 % IJ SOLN
INTRAMUSCULAR | Status: DC | PRN
  Administered 2020-11-24 (×2): 4 mL via EPIDURAL

## 2020-11-24 MED ORDER — DIBUCAINE (PERIANAL) 1 % EX OINT: 1.0000 "application " | TOPICAL_OINTMENT | CUTANEOUS | Status: DC | PRN

## 2020-11-24 MED ORDER — MISOPROSTOL 50MCG HALF TABLET: 50.0000 ug | ORAL_TABLET | ORAL | Status: DC | PRN

## 2020-11-24 MED ORDER — SIMETHICONE 80 MG PO CHEW: 80.0000 mg | CHEWABLE_TABLET | ORAL | Status: DC | PRN

## 2020-11-24 NOTE — Discharge Summary (Addendum)
Postpartum Discharge Summary  Date of Service updated     Patient Name: Felicia Harmon DOB: June 09, 2001 MRN: [id number]  Date of admission: 11/24/2020 Delivery date:11/24/2020  Delivering provider: Nicolette Bang  Date of discharge: 11/26/2020  Admitting diagnosis: Encounter for induction of labor [Z34.90] Intrauterine pregnancy: [redacted]w[redacted]d    Secondary diagnosis:  Active Problems:   Encounter for induction of labor   Nexplanon insertion  Additional problems: Anemia- Hgb- 7.0, administered Venofer   Discharge diagnosis: Term Pregnancy Delivered and GDM A1                                              Post partum procedures:None Augmentation: AROM and Pitocin Complications: None  Hospital course: Induction of Labor With Vaginal Delivery   20y.o. yo G3P2002 at 363w4das admitted to the hospital 11/24/2020 for induction of labor.  Indication for induction: A1 DM and Polyhydramnios.  Patient had an uncomplicated labor course as follows: Membrane Rupture Time/Date: 1:52 PM ,11/24/2020   Delivery Method:Vaginal, Spontaneous  Episiotomy: None  Lacerations:  None  Details of delivery can be found in separate delivery note.  Patient had a routine postpartum course. Patient is discharged home 11/26/20.  Newborn Data: Birth date:11/24/2020  Birth time:2:02 PM  Gender:Female  Living status:Living  Apgars:9 ,9  Weight:3391 g   Magnesium Sulfate received: No BMZ received: No Rhophylac:N/A MMR:N/A T-DaP: Transfusion:No  Physical exam  Vitals:   11/25/20 0521 11/25/20 1500 11/25/20 2333 11/26/20 0650  BP: 111/73 115/68 121/70 119/74  Pulse: 69 80 69 80  Resp: _0 Temp: 98.1 F (36.7 C) 98.4 F (36.9 C) 98.1 F (36.7 C) 98.7 F (37.1 C)  TempSrc: Oral Oral Oral Oral  SpO2: 100% 99% 100%   Weight:      Height:       General: alert, cooperative and no distress Lochia: appropriate Uterine Fundus: firm Incision: N/A DVT Evaluation: No evidence of DVT seen on  physical exam. Labs: Lab Results  Component Value Date   WBC 7.4 11/24/2020   HGB 8.8 (L) 11/24/2020   HCT 28.8 (L) 11/24/2020   MCV 78.5 (L) 11/24/2020   PLT 289 11/24/2020   CMP Latest Ref Rng & Units 11/04/2020  Glucose 65 - 99 mg/dL 78  BUN 6 - 20 mg/dL 4(L)  Creatinine 0.57 - 1.00 mg/dL 0.49(L)  Sodium 134 - 144 mmol/L 137  Potassium 3.5 - 5.2 mmol/L 4.0  Chloride 96 - 106 mmol/L 102  CO2 20 - 29 mmol/L 18(L)  Calcium 8.7 - 10.2 mg/dL 8.8  Total Protein 6.0 - 8.5 g/dL 6.8  Total Bilirubin 0.0 - 1.2 mg/dL 0.3  Alkaline Phos 42 - 106 IU/L 164(H)  AST 0 - 40 IU/L 12  ALT 0 - 32 IU/L 6   Edinburgh Score: Edinburgh Postnatal Depression Scale Screening Tool 11/25/2020  I have been able to laugh and see the funny side of things. 0  I have looked forward with enjoyment to things. 0  I have blamed myself unnecessarily when things went wrong. 1  I have been anxious or worried for no good reason. 0  I have felt scared or panicky for no good reason. 0  Things have been getting on top of me. 0  I have been so unhappy that I have had difficulty sleeping. 0  I have felt sad  Postpartum Discharge Summary  Date of Service updated     Patient Name: Felicia Harmon DOB: June 09, 2001 MRN: [id number]  Date of admission: 11/24/2020 Delivery date:11/24/2020  Delivering provider: Nicolette Bang  Date of discharge: 11/26/2020  Admitting diagnosis: Encounter for induction of labor [Z34.90] Intrauterine pregnancy: [redacted]w[redacted]d    Secondary diagnosis:  Active Problems:   Encounter for induction of labor   Nexplanon insertion  Additional problems: Anemia- Hgb- 7.0, administered Venofer   Discharge diagnosis: Term Pregnancy Delivered and GDM A1                                              Post partum procedures:None Augmentation: AROM and Pitocin Complications: None  Hospital course: Induction of Labor With Vaginal Delivery   20y.o. yo G3P2002 at 363w4das admitted to the hospital 11/24/2020 for induction of labor.  Indication for induction: A1 DM and Polyhydramnios.  Patient had an uncomplicated labor course as follows: Membrane Rupture Time/Date: 1:52 PM ,11/24/2020   Delivery Method:Vaginal, Spontaneous  Episiotomy: None  Lacerations:  None  Details of delivery can be found in separate delivery note.  Patient had a routine postpartum course. Patient is discharged home 11/26/20.  Newborn Data: Birth date:11/24/2020  Birth time:2:02 PM  Gender:Female  Living status:Living  Apgars:9 ,9  Weight:3391 g   Magnesium Sulfate received: No BMZ received: No Rhophylac:N/A MMR:N/A T-DaP: Transfusion:No  Physical exam  Vitals:   11/25/20 0521 11/25/20 1500 11/25/20 2333 11/26/20 0650  BP: 111/73 115/68 121/70 119/74  Pulse: 69 80 69 80  Resp: _0 Temp: 98.1 F (36.7 C) 98.4 F (36.9 C) 98.1 F (36.7 C) 98.7 F (37.1 C)  TempSrc: Oral Oral Oral Oral  SpO2: 100% 99% 100%   Weight:      Height:       General: alert, cooperative and no distress Lochia: appropriate Uterine Fundus: firm Incision: N/A DVT Evaluation: No evidence of DVT seen on  physical exam. Labs: Lab Results  Component Value Date   WBC 7.4 11/24/2020   HGB 8.8 (L) 11/24/2020   HCT 28.8 (L) 11/24/2020   MCV 78.5 (L) 11/24/2020   PLT 289 11/24/2020   CMP Latest Ref Rng & Units 11/04/2020  Glucose 65 - 99 mg/dL 78  BUN 6 - 20 mg/dL 4(L)  Creatinine 0.57 - 1.00 mg/dL 0.49(L)  Sodium 134 - 144 mmol/L 137  Potassium 3.5 - 5.2 mmol/L 4.0  Chloride 96 - 106 mmol/L 102  CO2 20 - 29 mmol/L 18(L)  Calcium 8.7 - 10.2 mg/dL 8.8  Total Protein 6.0 - 8.5 g/dL 6.8  Total Bilirubin 0.0 - 1.2 mg/dL 0.3  Alkaline Phos 42 - 106 IU/L 164(H)  AST 0 - 40 IU/L 12  ALT 0 - 32 IU/L 6   Edinburgh Score: Edinburgh Postnatal Depression Scale Screening Tool 11/25/2020  I have been able to laugh and see the funny side of things. 0  I have looked forward with enjoyment to things. 0  I have blamed myself unnecessarily when things went wrong. 1  I have been anxious or worried for no good reason. 0  I have felt scared or panicky for no good reason. 0  Things have been getting on top of me. 0  I have been so unhappy that I have had difficulty sleeping. 0  I have felt sad

## 2020-11-24 NOTE — Anesthesia Procedure Notes (Signed)
Epidural Patient location during procedure: OB Start time: 11/24/2020 1:02 PM End time: 11/24/2020 1:10 PM  Staffing Anesthesiologist: Mal Amabile, MD Performed: anesthesiologist   Preanesthetic Checklist Completed: patient identified, IV checked, site marked, risks and benefits discussed, surgical consent, monitors and equipment checked, pre-op evaluation and timeout performed  Epidural Patient position: sitting Prep: DuraPrep and site prepped and draped Patient monitoring: continuous pulse ox and blood pressure Approach: midline Location: L3-L4 Injection technique: LOR air  Needle:  Needle type: Tuohy  Needle gauge: 17 G Needle length: 9 cm and 9 Needle insertion depth: 5 cm cm Catheter type: closed end flexible Catheter size: 19 Gauge Catheter at skin depth: 10 cm Test dose: negative and Other  Assessment Events: blood not aspirated, injection not painful, no injection resistance, no paresthesia and negative IV test  Additional Notes Patient identified. Risks and benefits discussed including failed block, incomplete  Pain control, post dural puncture headache, nerve damage, paralysis, blood pressure Changes, nausea, vomiting, reactions to medications-both toxic and allergic and post Partum back pain. All questions were answered. Patient expressed understanding and wished to proceed. Sterile technique was used throughout procedure. Epidural site was Dressed with sterile barrier dressing. No paresthesias, signs of intravascular injection Or signs of intrathecal spread were encountered.  Patient was more comfortable after the epidural was dosed. Please see RN's note for documentation of vital signs and FHR which are stable. Reason for block:procedure for pain

## 2020-11-24 NOTE — Anesthesia Preprocedure Evaluation (Addendum)
Anesthesia Evaluation  Patient identified by MRN, date of birth, ID band Patient awake    Reviewed: Allergy & Precautions, Patient's Chart, lab work & pertinent test results  Airway Mallampati: II  TM Distance: >3 FB Neck ROM: Full    Dental no notable dental hx. (+) Teeth Intact   Pulmonary neg pulmonary ROS,  Hx/o Covid 19 infection 08/2020 , recovered   Pulmonary exam normal breath sounds clear to auscultation       Cardiovascular negative cardio ROS Normal cardiovascular exam Rhythm:Regular Rate:Normal     Neuro/Psych negative neurological ROS     GI/Hepatic Neg liver ROS, GERD  ,  Endo/Other  diabetes, Well Controlled, GestationalObesity  Renal/GU negative Renal ROS  negative genitourinary   Musculoskeletal   Abdominal (+) + obese,   Peds  Hematology  (+) anemia ,   Anesthesia Other Findings   Reproductive/Obstetrics                            Anesthesia Physical Anesthesia Plan  ASA: II  Anesthesia Plan: Epidural   Post-op Pain Management:    Induction:   PONV Risk Score and Plan:   Airway Management Planned: Natural Airway  Additional Equipment:   Intra-op Plan:   Post-operative Plan:   Informed Consent: I have reviewed the patients History and Physical, chart, labs and discussed the procedure including the risks, benefits and alternatives for the proposed anesthesia with the patient or authorized representative who has indicated his/her understanding and acceptance.       Plan Discussed with: Anesthesiologist  Anesthesia Plan Comments:         Anesthesia Quick Evaluation

## 2020-11-24 NOTE — H&P (Signed)
LABOR AND DELIVERY ADMISSION HISTORY AND PHYSICAL NOTE  Felicia Harmon is a 20 y.o. female G70P2002 with IUP at 16w4dby 6 week ultrasound presenting for IOL for polyhydramnios and A1GDM (suspected poor control).  She reports positive fetal movement. She denies leakage of fluid or vaginal bleeding.  Prenatal History/Complications: PNC at [facility] Pregnancy complications:  - AF8MCR reported to MFM 4/18 no longer checking CBGs, EFW 86%/3065g at 35w GA -new polyhydramnios with AFI 27  -COVID+ 1/10  -h/o suicide attempt in Nov  [facility]positive Chlamydia Dec 2021 with neg TOC Feb 22  -increased carrier risk for spinal muscular atrophy  -iron deficiency anemia   Past Medical History: Past Medical History:  Diagnosis Date  . Anemia   . Depression   . Gestational diabetes     Past Surgical History: Past Surgical History:  Procedure Laterality Date  . NO PAST SURGERIES      Obstetrical History: OB History    Gravida  3   Para  2   Term  2   Preterm      AB      Living  2     SAB      IAB      Ectopic      Multiple  0   Live Births  2           Social History: Social History   Socioeconomic History  . Marital status: Single    Spouse name: Not on file  . Number of children: 2  . Years of education: Not on file  . Highest education level: 9th grade  Occupational History  . Not on file  Tobacco Use  . Smoking status: Never Smoker  . Smokeless tobacco: Never Used  Vaping Use  . Vaping Use: Never used  Substance and Sexual Activity  . Alcohol use: No  . Drug use: No  . Sexual activity: Yes    Birth control/protection: None  Other Topics Concern  . Not on file  Social History Narrative  . Not on file   Social Determinants of Health   Financial Resource Strain: Not on file  Food Insecurity: Not on file  Transportation Needs: Not on file  Physical Activity: Not on file  Stress: Not on file  Social Connections: Not on  file    Family History: Family History  Problem Relation Age of Onset  . Healthy Mother   . Healthy Father     Allergies: No Known Allergies  Medications Prior to Admission  Medication Sig Dispense Refill Last Dose  . NIFEdipine (PROCARDIA) 10 MG capsule Take 1 capsule (10 mg total) by mouth 3 (three) times daily as needed. 90 capsule 0 Past Month at Unknown time  . Accu-Chek Softclix Lancets lancets 1 each by Other route 4 (four) times daily. Use as instructed (Patient not taking: Reported on 11/19/2020) 100 each 12   . acetaminophen (TYLENOL) 500 MG tablet Take 2 tablets (1,000 mg total) by mouth every 6 (six) hours as needed for fever or headache. (Patient not taking: No sig reported) 30 tablet 0   . Blood Glucose Monitoring Suppl (ACCU-CHEK GUIDE) w/Device KIT 1 kit by Does not apply route in the morning, at noon, in the evening, and at bedtime. (Patient not taking: Reported on 11/19/2020) 1 kit 0   . cyclobenzaprine (FLEXERIL) 10 MG tablet Take 1 tablet (10 mg total) by mouth every 8 (eight) hours as needed for muscle spasms. (Patient not  taking: No sig reported) 30 tablet 0   . ferrous sulfate (FERROUSUL) 325 (65 FE) MG tablet Take 1 tablet (325 mg total) by mouth every other day. (Patient not taking: No sig reported) 60 tablet 1   . glucose blood (ACCU-CHEK GUIDE) test strip Use as instructed (Patient not taking: Reported on 11/19/2020) 100 each 12   . Prenatal Vit-Fe Phos-FA-Omega (VITAFOL GUMMIES) 3.33-0.333-34.8 MG CHEW Chew 3 each by mouth daily. (Patient not taking: No sig reported) 90 tablet 12   . promethazine (PHENERGAN) 12.5 MG tablet Take 1-2 tablets (12.5-25 mg total) by mouth every 6 (six) hours as needed for nausea or vomiting. (Patient not taking: No sig reported) 30 tablet 2 More than a month at Unknown time  . terconazole (TERAZOL 7) 0.4 % vaginal cream Place 1 applicator vaginally at bedtime. Use for seven days 45 g 0      Review of Systems  All systems reviewed and  negative except as stated in HPI  Physical Exam Blood pressure 120/67, pulse 78, temperature 98.1 F (36.7 C), temperature source Oral, resp. rate 18, height 5' 8.5" (1.74 m), weight 114.9 kg, last menstrual period 02/06/2020, unknown if currently breastfeeding. General appearance: alert, oriented, NAD Lungs: normal respiratory effort Heart: regular rate Abdomen: soft, non-tender; gravid, FH appropriate for GA Extremities: No calf swelling or tenderness Presentation: cephalic Fetal monitoring: 135 bpm, mod variability, +acels, no decels  Uterine activity: Irregular  Dilation: 4 Effacement (%): 70 Station: -3 Exam by:: Ignacia Felling, RNC  Prenatal labs: ABO, Rh: --/--/A POS (04/19 0820) Antibody: NEG (04/19 0820) Rubella: 3.63 (12/15 1417) RPR: Non Reactive (02/18 0908)  HBsAg: Negative (12/15 1417)  HIV: Non Reactive (02/18 0908)  GC/Chlamydia: Negative  GBS: Negative/-- (04/14 1403)  2-hr GTT: Abnormal  Genetic screening:  Normal  Anatomy US: Normal   Prenatal Transfer Tool  Maternal Diabetes: Yes:  Diabetes Type:  Diet controlled Genetic Screening: Normal Maternal Ultrasounds/Referrals: Normal Fetal Ultrasounds or other Referrals:  Referred to Materal Fetal Medicine  Maternal Substance Abuse:  No Significant Maternal Medications:  None Significant Maternal Lab Results: Group B Strep negative  Results for orders placed or performed during the hospital encounter of 11/24/20 (from the past 24 hour(s))  CBC   Collection Time: 11/24/20  8:17 AM  Result Value Ref Range   WBC 7.4 4.0 - 10.5 K/uL   RBC 3.67 (L) 3.87 - 5.11 MIL/uL   Hemoglobin 8.8 (L) 12.0 - 15.0 g/dL   HCT 28.8 (L) 36.0 - 46.0 %   MCV 78.5 (L) 80.0 - 100.0 fL   MCH 24.0 (L) 26.0 - 34.0 pg   MCHC 30.6 30.0 - 36.0 g/dL   RDW 19.3 (H) 11.5 - 15.5 %   Platelets 289 150 - 400 K/uL   nRBC 0.0 0.0 - 0.2 %  Type and screen [place]   Collection Time: 11/24/20  8:20 AM  Result Value Ref Range    ABO/RH(D) A POS    Antibody Screen NEG    Sample Expiration      11/27/2020,2359 Performed at [facility], [address] [place], [place] [address]   Glucose, capillary   Collection Time: 11/24/20  8:31 AM  Result Value Ref Range   Glucose-Capillary 94 70 - 99 mg/dL  Resp Panel by RT-PCR (Flu A&B, Covid) Nasopharyngeal Swab   Collection Time: 11/24/20  8:38 AM   Specimen: Nasopharyngeal Swab; Nasopharyngeal(NP) swabs in vial transport medium  Result Value Ref Range  SARS Coronavirus 2 by RT PCR NEGATIVE NEGATIVE   Influenza A by PCR NEGATIVE NEGATIVE   Influenza B by PCR NEGATIVE NEGATIVE    Patient Active Problem List   Diagnosis Date Noted  . Encounter for induction of labor 11/24/2020  . Gestational diabetes 11/11/2020  . Gestational diabetes mellitus (GDM) in third trimester 09/25/2020  . COVID-19 08/18/2020  . Chlamydia infection affecting pregnancy in second trimester 07/28/2020  . Suicide attempt ([place]) 07/24/2020  . Iron deficiency anemia during pregnancy 07/23/2020  . Supervision of other normal pregnancy, antepartum 07/22/2020  . Short interval between pregnancies affecting pregnancy, antepartum 09/09/2019  . Pica 03/29/2018  . Rapid heartbeat 02/12/2018  . 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 G3P2002 at 78w4dhere for IOL for polyhydramnios and suspected poorly controlled A1GDM. Will monitor CBGs q4 during latent labor.   #Labor: Given cervix favorable, start with Pit 2x2.  #Pain: Plans for Epidural  #FWB: Cat I  #ID:  GBS neg  #MOF: Both  #MOC:Nexplanon PP  #Circ:  N/A  CMelina Schools4/19/2022, 10:03 AM

## 2020-11-25 ENCOUNTER — Encounter: Payer: Medicaid Other | Admitting: Student

## 2020-11-25 DIAGNOSIS — Z30017 Encounter for initial prescription of implantable subdermal contraceptive: Secondary | ICD-10-CM

## 2020-11-25 MED ORDER — ETONOGESTREL 68 MG ~~LOC~~ IMPL
68.0000 mg | DRUG_IMPLANT | Freq: Once | SUBCUTANEOUS | Status: AC
  Administered 2020-11-25: 68 mg via SUBCUTANEOUS
  Filled 2020-11-25: qty 1

## 2020-11-25 MED ORDER — LIDOCAINE HCL 1 % IJ SOLN
0.0000 mL | Freq: Once | INTRAMUSCULAR | Status: AC | PRN
  Administered 2020-11-25: 20 mL via INTRADERMAL
  Filled 2020-11-25: qty 20

## 2020-11-25 NOTE — Anesthesia Postprocedure Evaluation (Signed)
Anesthesia Post Note  Patient: Felicia Harmon  Procedure(s) Performed: AN AD HOC LABOR EPIDURAL     Patient location during evaluation: Mother Baby Anesthesia Type: Epidural Level of consciousness: awake, awake and alert and oriented Pain management: pain level controlled Vital Signs Assessment: post-procedure vital signs reviewed and stable Respiratory status: spontaneous breathing and respiratory function stable Cardiovascular status: blood pressure returned to baseline Postop Assessment: no headache, epidural receding, patient able to bend at knees, adequate PO intake, no backache, no apparent nausea or vomiting and able to ambulate Anesthetic complications: no   No complications documented.  Last Vitals:  Vitals:   11/24/20 2354 11/25/20 0521  BP: 102/71 111/73  Pulse: 74 69  Resp: 16 18  Temp: 36.9 C 36.7 C  SpO2: 100% 100%    Last Pain:  Vitals:   11/25/20 0725  TempSrc:   PainSc: 0-No pain   Pain Goal: Patients Stated Pain Goal: 3 (11/25/20 0521)                 Cleda Clarks

## 2020-11-25 NOTE — Social Work (Signed)
CSW received consult for hx of history of depression, suicide attempt during pregnancy and Late PNC. CSW met with MOB to offer support and complete assessment.    CSW met with the MOB at bedside. CSW introduced role and congratulated MOB. CSW observed MOB holding and bonding with the infant. CSW explain the reason for the visit. MOB pleasant and receptive to the visit. CSW asked MOB how she feel since giving birth. MOB reports, " I feel alight. I am glad she is here." MOB reports she had a rough pregnancy. CSW asked MOB to explain what made her pregnancy rough. MOB reports the pregnancy felt long, with on and off days. MOB reports the pregnancy was hard and kind of stressful. MOB reports she was working and still caring for her other two children. MOB reports last year during November she felt very depressed and felt she wanted to end everything and took some pills. MOB reports she went to the emergency room the following day to notify the medical provider and check on baby. MOB denies having thoughts since then.  MOB reports she was diagnosed with depression at the age of 15. MOB reports she was in foster care and separated from her siblings. MOB reports she did receive therapy at the time. CSW explored MOB coping skills. MOB reports, " I just think about my children." CSW asked MOB about her supports. MOB acknowledge the FOB, and her first daughter relatives a support.   CSW assessed MOB for safety, MOB denies thoughts of harming self and others. MOB denies domestic violence. CSW provided education regarding the baby blues period vs. perinatal mood disorders, discussed treatment options and gave her resources for mental health follow up. CSW recommended MOB complete a self-evaluation during the postpartum time period using the New Mom Checklist from Postpartum Progress and encouraged MOB to contact a medical professional if symptoms are noted at any time. MOB agreeable and receptive to the resources.    CSW  provided review of Sudden Infant Death Syndrome (SIDS) precautions and informed MOB no-co sleeping with the infant. CSW asked MOB about the infant's safe sleep space. MOB reports the infant has a bassinet. CSW asked MOB if she has essential items for the infant. MOB reports she has essential items for the infant including a car seat.  MOB reports she received WIC and food stamp services. MOB plans to call to add the infant. MOB chose pediatrician at [facility]. CSW assessed MOB for further needs. MOB reports no further need.   CSW received this consult for late and limited PNC.CSW reviewed chart, it does not meet criteria. MOB started care prior to 28 weeks and had more than 3 visits.   CSW identifies no further need for intervention and no barriers to discharge at this time.   , MSW, LCSW [facility]  Clinical Social Worker  [phone] 11/25/2020  12:50 PM 

## 2020-11-25 NOTE — Progress Notes (Signed)
POSTPARTUM PROGRESS NOTE  Subjective: Felicia Harmon is a 20 y.o. [id number] s/p SVD at [redacted]w[redacted]d.  She reports she doing well. No acute events overnight. She denies any problems with ambulating, voiding or po intake. Denies nausea or vomiting. She has passed flatus. Pain is well controlled.  Lochia is good.  Patient endorses fatigue  Objective: Blood pressure 111/73, pulse 69, temperature 98.1 F (36.7 C), temperature source Oral, resp. rate 18, height 5' 8.5" (1.74 m), weight 114.9 kg, last menstrual period 02/06/2020, SpO2 100 %, unknown if currently breastfeeding.  Physical Exam:  General: alert, cooperative and no distress Chest: no respiratory distress Abdomen: soft, non-tender  Uterine Fundus: firm and at level of umbilicus  Recent Labs    11/24/20 0817  HGB 8.8*  HCT 28.8*    Assessment/Plan: Felicia Harmon is a 20 y.o. S9F0263 s/p SVD at [redacted]w[redacted]d for IOL for polyhydramnios and A1GDM (suspected poor control).  Routine Postpartum Care: Doing well, pain well-controlled.  -- Continue routine care, lactation support  -- Contraception: Nexplanon inserted -- Feeding: Breast and Bottle -- SW consult for Late prenatal care/suicide attempt  Dispo: Plan for discharge 4/21.  Jovita Kussmaul, MD OB Fellow, [facility] 11/25/2020 9:03 AM

## 2020-11-25 NOTE — Lactation Note (Signed)
This note was copied from a baby's chart. Lactation Consultation Note  Patient Name: Felicia Harmon Today's Date: 11/25/2020 Reason for consult: Initial assessment;Early term 37-38.6wks Age:20   LC Student Addysen Louth arrived in the room where MOB and infant were present. Infant was cueing to feed and mom agreed to get her latched. MOB reported last feed was at about 9:30am. LC Student assisted MOB in unswaddling the infant and placing the infant in football position. LC Student also assisted MOB in positioning support pillows. LC Student used the teacup method and was able to get infant to latch after a few attempts. Once latched, infant began sucking. The infant would suck on and off so LC Student instructed MOB of ways she could keep the infant stimulated. Audible swallows could be heard with stimulation. [facility] Student reviewed information on the importance of stimulating the breast, cluster feeding, milk storage guidelines, pre-pumping, skin to skin, input and output, and the importance of having a deep latch. MOB stated she understood this information and did not have any questions or concerns. LC Student demonstrated to MOB how to hand express as well as pace bottle feed. MOB stated she understood. MOB was provided with brochure with information about outpatient services and informed about the breastfeeding support group. MOB reported she is a [facility] recipient and has a personal pump at home. LC Student instructed her to call for assistance if she has any further questions or concerns.  MOB plans to continue to breastfeed and formula feed as her feeding plan. Mom was encouraged to continue to put infant to breast skin to skin 8-12x a day, pre pump to help with latching due to flat nipples, and to follow infant feeding guidelines.    Maternal Data Has patient been taught Hand Expression?: Yes Does the patient have breastfeeding experience prior to this delivery?: Yes How long did the  patient breastfeed?: about a month with previous two children  Feeding Mother's Current Feeding Choice: Breast Milk and Formula  LATCH Score Latch: Repeated attempts needed to sustain latch, nipple held in mouth throughout feeding, stimulation needed to elicit sucking reflex.  Audible Swallowing: A few with stimulation  Type of Nipple: Flat  Comfort (Breast/Nipple): Soft / non-tender  Hold (Positioning): Assistance needed to correctly position infant at breast and maintain latch.  LATCH Score: 6   Lactation Tools Discussed/Used Tools: Manual Pump  Interventions Interventions: Breast feeding basics reviewed;Assisted with latch;Skin to skin;Adjust position;Hand express;Education  Discharge Pump: Personal WIC Program: Yes  Consult Status Consult Status: Follow-up Date: 11/26/20 Follow-up type: In-patient    Ociel Retherford Katrinka Blazing 11/25/2020, 12:04 PM

## 2020-11-25 NOTE — Procedures (Signed)
POSTPLACENTAL NEXPLANON INSERTION Patient name: Felicia Harmon MRN [id number]  Date of birth: 2001/02/09  Risks/benefits/side effects of Nexplanon have been discussed and her questions have been answered.  Specifically, a failure rate of 08/998 has been reported, with an increased failure rate if pt takes St. John's Wort and/or antiseizure medicaitons.  She is aware of the common side effect of irregular bleeding, which the incidence of decreases over time. Signed copy of informed consent in chart.   Time out was performed.  She is right-handed, so her left arm, approximately 8-10 cm (3-4 inches) from the medial epicondyle of the humerus and 3-5 cm (1.25-2 inches) posterior to (below) the sulcus (groove) between the biceps and triceps muscle was cleansed with alcohol and anesthetized with 2cc of 2% Lidocaine.  The area was cleansed again with betadine and the Nexplanon was inserted per manufacturer's recommendations without difficulty.  3 steri-strips and pressure bandage were applied. The patient tolerated the procedure well. There was minimal blood loss.   Patient was given post procedure instructions and Nexplanon user card with expiration date. Patient was asked to keep the pressure dressing on for 24 hours to minimize bruising and keep the adhesive bandage on for 3-5 days. The patient verbalized understanding of the plan of care and agrees.   Pt added to the post-placental Nexplanon list and charges entered.   Cheral Marker CNM, [facility] 11/25/2020 8:34 AM

## 2020-11-26 MED ORDER — COCONUT OIL OIL: 1.0000 "application " | TOPICAL_OIL | 0 refills | Status: DC | PRN

## 2020-11-26 MED ORDER — IBUPROFEN 600 MG PO TABS: 600.0000 mg | ORAL_TABLET | Freq: Four times a day (QID) | ORAL | 0 refills | Status: DC

## 2020-11-26 MED ORDER — SIMETHICONE 80 MG PO CHEW: 80.0000 mg | CHEWABLE_TABLET | ORAL | 0 refills | Status: DC | PRN

## 2020-11-26 NOTE — Lactation Note (Signed)
This note was copied from a baby's chart. Lactation Consultation Note  Patient Name: Felicia Harmon FYBOF'B Date: 11/26/2020 Reason for consult: Follow-up assessment;Early term 37-38.6wks Age:20 hours  P3 mother whose infant is now 77 hours old.  This is an ETI at 37+4 weeks.  Mother breast fed her first child for one month.  Her feeding preference is breast/bottle.  Baby was asleep in mother's arms when I arrived.  Mother had no questions/concerns related to breast feeding.  Mother wanted to "wait" until she gets home to breast feed any more.  Explained lactogenesis II and how to stimulate breasts to help ensure a good milk supply.  Reviewed hand expression.    Mother has a DEBP for home use.  Manual pump at bedside and mother verbalized understanding of the pump use.  Flange size is appropriate.  Mother has our OP phone number as needed.  No support person present at this time.   Maternal Data Has patient been taught Hand Expression?: Yes Does the patient have breastfeeding experience prior to this delivery?: Yes How long did the patient breastfeed?: one month with her first child  Feeding Mother's Current Feeding Choice: Breast Milk and Formula  LATCH Score                    Lactation Tools Discussed/Used Breast pump type: Manual  Interventions Interventions: Education  Discharge Discharge Education: Engorgement and breast care Pump: Manual;Personal  Consult Status Consult Status: Complete Date: 11/26/20 Follow-up type: Call as needed    Damieon Armendariz R Rajiv Parlato 11/26/2020, 10:16 AM

## 2020-12-02 ENCOUNTER — Ambulatory Visit (INDEPENDENT_AMBULATORY_CARE_PROVIDER_SITE_OTHER): Payer: Medicaid Other | Admitting: Licensed Clinical Social Worker

## 2020-12-02 DIAGNOSIS — F32A Depression, unspecified: Secondary | ICD-10-CM

## 2020-12-02 DIAGNOSIS — F320 Major depressive disorder, single episode, mild: Secondary | ICD-10-CM

## 2020-12-03 ENCOUNTER — Encounter: Payer: Medicaid Other | Admitting: Obstetrics and Gynecology

## 2020-12-03 ENCOUNTER — Ambulatory Visit: Payer: Medicaid Other

## 2020-12-04 NOTE — BH Specialist Note (Signed)
Integrated Behavioral Health Initial In-Person Visit  MRN: [id number] Name: Felicia Harmon  Number of Integrated Behavioral Health Clinician visits:: 1/6 Session Start time: 1:00pm  Session End time: 1:36p Total time: 36 minutes via mychart video   Types of Service: Individual psychotherapy/Video Visit  Interpretor:No Interpretor Name and Language: None    Warm Hand Off Completed.       Subjective: Felicia Harmon is a 20 y.o. female accompanied by n/a Patient was referred by Darcella Cheshire RN for at risk for postpartum depression. Patient reports the following symptoms/concerns: depressed mood, crying, social isolation, lack of support, previous suicide attempt  Duration of problem: over one year; Severity of problem: moderate   Objective: Mood: Sad and Affect: tearful  Risk of harm to self or others: History of suicide attempt Nov 2021  Life Context: Family and Social: Lives with Father of children in [place]  School/Work: Administrator, sports  Self-Care: none  Life Changes: short interval between all pregnancies   Patient and/or Family's Strengths/Protective Factors: Lack of family and social supports   Goals Addressed: Patient will: 1. Reduce symptoms of: Major depression Disorder  2. Increase knowledge of diagnosis and utilize copings skills to alleviate symptoms  3. Demonstrate ability to: self manage symptoms  Progress towards Goals: ongoing  Interventions: Interventions utilized: supportive counseling   Standardized Assessments completed: edinburgh completed 11/25/2020 score 1   Assessment: Patient currently experiencing  Major depressive disorder    Patient may benefit from [facility]   Plan: 1. Follow up with behavioral health clinician on : 12/17/2020 2. Behavioral recommendations: keeps all medical appts, take medication as prescribed, prioritize rest, schedule time for self care. 3. Referral(s): [facility] 4. "From scale of 1-10,  how likely are you to follow plan?":  Gwyndolyn Saxon, LCSW

## 2020-12-04 NOTE — Addendum Note (Signed)
Addended by: Gwyndolyn Saxon on: 12/04/2020 11:19 AM   Modules accepted: Level of Service

## 2020-12-10 ENCOUNTER — Other Ambulatory Visit: Payer: Self-pay

## 2020-12-10 ENCOUNTER — Telehealth (INDEPENDENT_AMBULATORY_CARE_PROVIDER_SITE_OTHER): Payer: Medicaid Other | Admitting: Licensed Clinical Social Worker

## 2020-12-10 DIAGNOSIS — F32A Depression, unspecified: Secondary | ICD-10-CM

## 2020-12-10 DIAGNOSIS — F320 Major depressive disorder, single episode, mild: Secondary | ICD-10-CM

## 2020-12-10 NOTE — Progress Notes (Signed)
Integrated Behavioral Health via Telemedicine Visit  12/10/2020 Felicia Harmon [id number]  Number of Integrated Behavioral Health visits: 2/6 Session Start time: 10:08am Session End time: 10:24am Total time: 16 mins via phone   Referring Provider: Carloyn Jaeger CNM Patient/Family location: Home  [facility] Provider location: [facility]  All persons participating in visit:Felicia Harmon and LCSWA A. Felton Clinton  Types of Service: General Behavioral Health   I connected with Felicia Harmon and/or Felicia Harmon's n/a via  Telephone or Engineer, civil (consulting)  (Video is Surveyor, mining) and verified that I am speaking with the correct person using two identifiers. Discussed confidentiality: Yes   I discussed the limitations of telemedicine and the availability of in person appointments.  Discussed there is a possibility of technology failure and discussed alternative modes of communication if that failure occurs.  I discussed that engaging in this telemedicine visit, they consent to the provision of behavioral healthcare and the services will be billed under their insurance.  Patient and/or legal guardian expressed understanding and consented to Telemedicine visit: Yes   Presenting Concerns: Patient and/or family reports the following symptoms/concerns: depression  Duration of problem: Since 2017; Severity of problem: mild  Patient and/or Family's Strengths/Protective Factors: Ms. Lose reports no social or family support   Goals Addressed: Patient will: 1.  Reduce symptoms of: depression and stress  2.  Increase knowledge and/or ability of: stress reduction  3.  Demonstrate ability to: Increase adequate support systems for patient/family  Progress towards Goals: Ongoing  Interventions: Interventions utilized:  Supportive Counseling Standardized Assessments completed: PHQ 9  Assessment: Patient currently experiencing mild episode of depression   Patient may benefit  from behavioral health   Plan: 1. Follow up with behavioral health clinician on : as needed  2. Behavioral recommendations: Engage in stress reducing activity, call 911, mobile crisis or [facility] for immediate assistance  3. Referral(s): [facility] (LME/Outside Clinic)  I discussed the assessment and treatment plan with the patient and/or parent/guardian. They were provided an opportunity to ask questions and all were answered. They agreed with the plan and demonstrated an understanding of the instructions.   They were advised to call back or seek an in-person evaluation if the symptoms worsen or if the condition fails to improve as anticipated.  Gwyndolyn Saxon, LCSW

## 2020-12-11 ENCOUNTER — Inpatient Hospital Stay (HOSPITAL_COMMUNITY): Admit: 2020-12-11 | Payer: Self-pay

## 2020-12-17 ENCOUNTER — Ambulatory Visit: Payer: Medicaid Other | Admitting: Licensed Clinical Social Worker

## 2020-12-30 ENCOUNTER — Ambulatory Visit: Payer: Medicaid Other | Admitting: Obstetrics and Gynecology

## 2021-02-09 ENCOUNTER — Emergency Department (HOSPITAL_COMMUNITY)
Admission: EM | Admit: 2021-02-09 | Discharge: 2021-02-09 | Disposition: A | Discharge status: Home / Self Care | Payer: Medicaid Other | Attending: Emergency Medicine | Admitting: Emergency Medicine

## 2021-02-09 ENCOUNTER — Other Ambulatory Visit: Payer: Self-pay

## 2021-02-09 DIAGNOSIS — J069 Acute upper respiratory infection, unspecified: Secondary | ICD-10-CM | POA: Diagnosis not present

## 2021-02-09 DIAGNOSIS — J029 Acute pharyngitis, unspecified: Secondary | ICD-10-CM | POA: Diagnosis present

## 2021-02-09 DIAGNOSIS — Z20822 Contact with and (suspected) exposure to covid-19: Secondary | ICD-10-CM | POA: Diagnosis not present

## 2021-02-09 DIAGNOSIS — Z8616 Personal history of COVID-19: Secondary | ICD-10-CM | POA: Diagnosis not present

## 2021-02-09 LAB — RESP PANEL BY RT-PCR (FLU A&B, COVID) ARPGX2
Influenza A by PCR: NEGATIVE
Influenza B by PCR: NEGATIVE
SARS Coronavirus 2 by RT PCR: NEGATIVE

## 2021-02-09 LAB — GROUP A STREP BY PCR: Group A Strep by PCR: NOT DETECTED

## 2021-02-09 MED ORDER — ACETAMINOPHEN 500 MG PO TABS
1000.0000 mg | ORAL_TABLET | Freq: Once | ORAL | Status: AC
  Administered 2021-02-09: 1000 mg via ORAL
  Filled 2021-02-09: qty 2

## 2021-02-09 NOTE — ED Provider Notes (Signed)
[facility]PART[facility] Provider Note   CSN: [id number] Arrival date & time: 02/09/21  1733     History Chief Complaint  Patient presents with   Fever   Shortness of Breath    Felicia Harmon is a 20 y.o. female.  Pt presents to the ED today with fever, sore throat, body aches.  Pt said she developed sx yesterday.  She is not vaccinated against Covid.  Pt did have a temp of 102.2 upon arrival.  She denies known sick contacts.      Past Medical History:  Diagnosis Date   Anemia    Depression    Gestational diabetes     Patient Active Problem List   Diagnosis Date Noted   Nexplanon insertion 11/25/2020   Encounter for induction of labor 11/24/2020   Gestational diabetes 11/11/2020   COVID-19 08/18/2020   Chlamydia infection affecting pregnancy in second trimester 07/28/2020   Suicide attempt ([place]) 07/24/2020   Iron deficiency anemia during pregnancy 07/23/2020   Supervision of other normal pregnancy, antepartum 07/22/2020   Short interval between pregnancies affecting pregnancy, antepartum 09/09/2019   Pica 03/29/2018   Rapid heartbeat 02/12/2018   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 Authorizing Provider  Accu-Chek Softclix Lancets lancets 1 each by Other route 4 (four) times  daily. Use as instructed 09/29/20   Gavin Pound, CNM  acetaminophen (TYLENOL) 500 MG tablet Take 2 tablets (1,000 mg total) by mouth every 6 (six) hours as needed for fever or headache. 08/17/20   Cresenzo-Dishmon, Joaquim Lai, CNM  Blood Glucose Monitoring Suppl (ACCU-CHEK GUIDE) w/Device KIT 1 kit by Does not apply route in the morning, at noon, in the evening, and at bedtime. 09/29/20   Gavin Pound, CNM  coconut oil OIL Apply 1 application topically as needed. 11/26/20   Maness, Arnette Norris, MD  glucose blood (ACCU-CHEK GUIDE) test strip Use as instructed 09/29/20   Gavin Pound, CNM  ibuprofen (ADVIL) 600 MG tablet Take 1 tablet (600 mg total) by mouth every 6 (six) hours. 11/26/20   Maness, Arnette Norris, MD  simethicone (MYLICON) 80 MG chewable tablet Chew 1 tablet (80 mg total) by mouth as needed for flatulence. 11/26/20   Delora Fuel, MD    Allergies    Patient has no known allergies.  Review of Systems   Review of Systems  Constitutional:  Positive for fever.  HENT:  Positive for congestion and sore throat.   All other  systems reviewed and are negative.  Physical Exam Updated Vital Signs BP 122/81   Pulse 82   Temp 99.1 F (37.3 C) (Oral)   Resp 14   LMP 02/06/2020 (Approximate)   SpO2 100%   Physical Exam Vitals and nursing note reviewed.  Constitutional:      Appearance: She is well-developed.  HENT:     Head: Normocephalic and atraumatic.     Mouth/Throat:     Mouth: Mucous membranes are moist.     Pharynx: Oropharynx is clear.  Eyes:     Extraocular Movements: Extraocular movements intact.     Pupils: Pupils are equal, round, and reactive to light.  Cardiovascular:     Rate and Rhythm: Normal rate and regular rhythm.  Pulmonary:     Effort: Pulmonary effort is normal.     Breath sounds: Normal breath sounds.  Abdominal:     General: Bowel sounds are normal.     Palpations: Abdomen is soft.  Musculoskeletal:        General: Normal range of motion.     Cervical back:  Normal range of motion and neck supple.  Skin:    General: Skin is warm.     Capillary Refill: Capillary refill takes less than 2 seconds.  Neurological:     General: No focal deficit present.     Mental Status: She is alert and oriented to person, place, and time.  Psychiatric:        Mood and Affect: Mood normal.        Behavior: Behavior normal.    ED Results / Procedures / Treatments   Labs (all labs ordered are listed, but only abnormal results are displayed) Labs Reviewed  RESP PANEL BY RT-PCR (FLU A&B, COVID) ARPGX2  GROUP A STREP BY PCR    EKG None  Radiology No results found.  Procedures Procedures   Medications Ordered in ED Medications  acetaminophen (TYLENOL) tablet 1,000 mg (1,000 mg Oral Given 02/09/21 1804)    ED Course  I have reviewed the triage vital signs and the nursing notes.  Pertinent labs & imaging results that were available during my care of the patient were reviewed by me and considered in my medical decision making (see chart for details).    MDM Rules/Calculators/A&P                          Covid negative.  Strep neg.  Sx started yesterday, so she is told to redo a covid test tomorrow if still feeling ill.  Return if worse.  F/u with pcp. Final Clinical Impression(s) / ED Diagnoses Final diagnoses:  Viral upper respiratory tract infection    Rx / DC Orders ED Discharge Orders     None        Isla Pence, MD 02/09/21 2304

## 2021-02-09 NOTE — ED Triage Notes (Signed)
Pt reports nasal congestion, sore throat, body aches, and fever since yesterday. Denies sick contacts.

## 2021-02-09 NOTE — Discharge Instructions (Signed)
Alternate tylenol and ibuprofen for fever.  If you continue to feel ill or have a fever, you should repeat your Covid test as the initial one may be a false negative.

## 2021-02-12 ENCOUNTER — Emergency Department (HOSPITAL_COMMUNITY): Payer: Medicaid Other

## 2021-02-12 ENCOUNTER — Emergency Department (HOSPITAL_COMMUNITY)
Admission: EM | Admit: 2021-02-12 | Discharge: 2021-02-13 | Disposition: A | Discharge status: Home / Self Care | Payer: Medicaid Other | Attending: Emergency Medicine | Admitting: Emergency Medicine

## 2021-02-12 ENCOUNTER — Encounter (HOSPITAL_COMMUNITY): Payer: Self-pay | Admitting: Emergency Medicine

## 2021-02-12 DIAGNOSIS — R509 Fever, unspecified: Secondary | ICD-10-CM | POA: Diagnosis present

## 2021-02-12 DIAGNOSIS — U071 COVID-19: Secondary | ICD-10-CM | POA: Diagnosis not present

## 2021-02-12 DIAGNOSIS — J069 Acute upper respiratory infection, unspecified: Secondary | ICD-10-CM

## 2021-02-12 MED ORDER — ACETAMINOPHEN 325 MG PO TABS
650.0000 mg | ORAL_TABLET | Freq: Once | ORAL | Status: AC
  Administered 2021-02-12: 650 mg via ORAL
  Filled 2021-02-12: qty 2

## 2021-02-12 NOTE — ED Triage Notes (Signed)
Pt was seen on the 5th of this month for same symptoms body aches, fever, chills, tested negative for strep and  Covid but continues to having fever and body aches.

## 2021-02-12 NOTE — ED Provider Notes (Signed)
Emergency Medicine Provider Triage Evaluation Note  Felicia Harmon , a 20 y.o. female  was evaluated in triage.  Pt complains of continued fever she states her symptoms been ongoing for 1 week mostly complaining of myalgias fevers chills sore throat she was tested for COVID and strep days ago which were found to be negative.  Review of Systems  Positive: Fever Negative: Cough  Physical Exam  BP 134/85 (BP Location: Left Arm)   Pulse 98   Temp 98.7 F (37.1 C)   Resp 16   LMP 02/06/2020 (Approximate)   SpO2 100%  Gen:   Awake, no distress  Resp:  Normal effort  MSK:   Moves extremities without difficulty  Other:    Medical Decision Making  Medically screening exam initiated at 9:17 PM.  Appropriate orders placed.  [place]a [place] was informed that the remainder of the evaluation will be completed by another provider, this initial triage assessment does not replace that evaluation, and the importance of remaining in the ED until their evaluation is complete.  Will obtain chest x-ray given a prolonged fever low suspicion for pneumonia.  Patient overall does not appear toxic.  Reviewed prior her COVID and strep test were negative at that visit   Gailen Shelter, [place] 02/12/21 2120    Eber Hong, MD 02/12/21 2144

## 2021-02-13 LAB — SARS CORONAVIRUS 2 (TAT 6-24 HRS): SARS Coronavirus 2: POSITIVE — AB

## 2021-02-13 NOTE — Discharge Instructions (Addendum)
If you had any testing done today, the results will show up on your MyChart phone app in 24 hours.  Call your primary care doctor in the next 3-4 days.

## 2021-02-13 NOTE — ED Provider Notes (Signed)
[facility] EMERGENCY DEPARTMENT Provider Note   CSN: [id number] Arrival date & time: 02/12/21  1920     History Chief Complaint  Patient presents with   Generalized Body Aches    Felicia Harmon is a 20 y.o. female.  Patient presents to ER chief complaint of continued fevers sore throat generalized malaise.  Symptoms ongoing for a week waxing and waning.  Patient was seen here few days ago had negative work-up for COVID and strep throat.  She returns for persistent symptoms.  Denies vomiting or diarrhea denies chest pain.      Past Medical History:  Diagnosis Date   Anemia    Depression    Gestational diabetes     Patient Active Problem List   Diagnosis Date Noted   Nexplanon insertion 11/25/2020   Encounter for induction of labor 11/24/2020   Gestational diabetes 11/11/2020   COVID-19 08/18/2020   Chlamydia infection affecting pregnancy in second trimester 07/28/2020   Suicide attempt ([place]) 07/24/2020   Iron deficiency anemia during pregnancy 07/23/2020   Supervision of other normal pregnancy, antepartum 07/22/2020   Short interval between pregnancies affecting pregnancy, antepartum 09/09/2019   Pica 03/29/2018   Rapid heartbeat 02/12/2018   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  Authorizing Provider  Accu-Chek Softclix Lancets lancets 1 each by Other route 4 (four) times daily. Use as instructed 09/29/20   Gavin Pound, CNM  acetaminophen (TYLENOL) 500 MG tablet Take 2 tablets (1,000 mg total) by mouth every 6 (six) hours as needed for fever or headache. 08/17/20   Cresenzo-Dishmon, Joaquim Lai, CNM  Blood Glucose Monitoring Suppl (ACCU-CHEK GUIDE) w/Device KIT 1 kit by Does not apply route in the morning, at noon, in the evening, and at bedtime. 09/29/20   Gavin Pound, CNM  coconut oil OIL Apply 1 application topically as needed. 11/26/20   Maness, Arnette Norris, MD  glucose blood (ACCU-CHEK GUIDE) test strip Use as instructed 09/29/20   Gavin Pound, CNM  ibuprofen (ADVIL) 600 MG tablet Take 1 tablet (600 mg total) by mouth every 6 (six) hours. 11/26/20   Maness, Arnette Norris, MD  simethicone (MYLICON) 80 MG chewable tablet Chew 1 tablet (80 mg total) by mouth as needed for flatulence. 11/26/20   Delora Fuel, MD    Allergies    Patient has no known allergies.  Review of Systems   Review of Systems  Constitutional:  Positive for fever.  HENT:  Negative for ear  pain.   Eyes:  Negative for pain.  Respiratory:  Negative for cough.   Cardiovascular:  Negative for chest pain.  Gastrointestinal:  Negative for abdominal pain.  Genitourinary:  Negative for flank pain.  Musculoskeletal:  Negative for back pain.  Skin:  Negative for rash.  Neurological:  Negative for headaches.   Physical Exam Updated Vital Signs BP 114/76 (BP Location: Right Arm)   Pulse 79   Temp 98.7 F (37.1 C)   Resp 16   LMP 02/06/2020 (Approximate)   SpO2 100%   Physical Exam Constitutional:      General: She is not in acute distress.    Appearance: Normal appearance.  HENT:     Head: Normocephalic.     Nose: Nose normal.     Mouth/Throat:     Mouth: Mucous membranes are moist.     Pharynx: Oropharynx is clear. No oropharyngeal exudate or posterior oropharyngeal erythema.  Eyes:     Extraocular  Movements: Extraocular movements intact.  Cardiovascular:     Rate and Rhythm: Normal rate.  Pulmonary:     Effort: Pulmonary effort is normal.  Musculoskeletal:        General: Normal range of motion.     Cervical back: Normal range of motion.  Lymphadenopathy:     Cervical: No cervical adenopathy.  Neurological:     General: No focal deficit present.     Mental Status: She is alert. Mental status is at baseline.    ED Results / Procedures / Treatments   Labs (all labs ordered are listed, but only abnormal results are displayed) Labs Reviewed  SARS CORONAVIRUS 2 (TAT 6-24 HRS)    EKG None  Radiology DG Chest 2 View  Result Date: 02/12/2021 CLINICAL DATA:  20 year old female with fever. EXAM: CHEST - 2 VIEW COMPARISON:  None. FINDINGS: The heart size and mediastinal contours are within normal limits. Both lungs are clear. The visualized skeletal structures are unremarkable. IMPRESSION: No active cardiopulmonary disease. Electronically Signed   By: Anner Crete M.D.   On: 02/12/2021 21:35    Procedures Procedures   Medications Ordered in ED Medications  acetaminophen (TYLENOL) tablet 650 mg (650 mg Oral Given 02/12/21 2110)    ED Course  I have reviewed the triage vital signs and the nursing notes.  Pertinent labs & imaging results that were available during my care of the patient were reviewed by me and considered in my medical decision making (see chart for details).    MDM Rules/Calculators/A&P                          Patient has a benign appearing posterior pharynx no erythema no exudate no tonsillar enlargement or cobblestoning noted.  No cervical adenopathy noted.  Chest x-ray is unremarkable no infiltrate effusion or edema per radiology.  Patient resolved for COVID again.  Recommended follow-up with her doctor again within the week.  Recommended return for worsening symptoms.  Final Clinical Impression(s) / ED Diagnoses Final diagnoses:  None    Rx / DC  Orders ED Discharge Orders     None        Luna Fuse, MD 02/13/21 [phone]

## 2021-08-23 ENCOUNTER — Encounter: Payer: Self-pay | Admitting: Obstetrics and Gynecology

## 2021-09-20 ENCOUNTER — Telehealth: Payer: Self-pay | Admitting: Obstetrics and Gynecology

## 2021-09-20 NOTE — Telephone Encounter (Signed)
Patient called to schedule an appointment to get her birth control removed. Informed her of the changes be made at this location, and she requested to go to the [facility]. Gave patient phone number.

## 2021-10-14 ENCOUNTER — Emergency Department (HOSPITAL_COMMUNITY)
Admission: EM | Admit: 2021-10-14 | Discharge: 2021-10-14 | Disposition: A | Discharge status: Home / Self Care | Payer: Medicaid Other | Attending: Emergency Medicine | Admitting: Emergency Medicine

## 2021-10-14 ENCOUNTER — Other Ambulatory Visit: Payer: Self-pay

## 2021-10-14 ENCOUNTER — Encounter (HOSPITAL_COMMUNITY): Payer: Self-pay | Admitting: Emergency Medicine

## 2021-10-14 DIAGNOSIS — L732 Hidradenitis suppurativa: Secondary | ICD-10-CM | POA: Diagnosis not present

## 2021-10-14 LAB — POC URINE PREG, ED: Preg Test, Ur: NEGATIVE

## 2021-10-14 NOTE — ED Triage Notes (Signed)
Patient here requesting evaluation of small "bumps" that have been appearing for several years in her axilla, abdomen, buttocks, and groin. Patient states the "bumps" are painful so she pops them in the shower and the pain resolves. Patient is alert, oriented, and in no apparent this time. ?

## 2021-10-14 NOTE — ED Notes (Signed)
Patient discharge instructions reviewed with the patient. The patient verbalized understanding of instructions. Patient discharged. 

## 2021-10-14 NOTE — ED Provider Notes (Signed)
?[facility] ?Provider Note ? ? ?CSN: [id number] ?Arrival date & time: 10/14/21  1101 ? ?  ? ?History ? ?Chief Complaint  ?Patient presents with  ? Wound Check  ? ? ?Felicia Harmon is a 21 y.o. female. ? ?The history is provided by the patient. No language interpreter was used.  ?Wound Check ? ? ?21 year old female presenting complaining of concern for skin infection.  Patient report recurrent bumps that appears on his skin for the past several years that she worries about potential infection.  States that appearing her right axillary region, in her buttock, abdominal wall, and between her thigh that sometimes is painful and sometimes she is able to express purulent discharge from these bumps.  Most recent was 1 near her buttock.  No associated fever no trauma.  No other treatment tried.  Denies any change in her environment or body products no itchiness. ? ?Home Medications ?Prior to Admission medications   ?Medication Sig Start Date End Date Taking? Authorizing Provider  ?Accu-Chek Softclix Lancets lancets 1 each by Other route 4 (four) times daily. Use as instructed 09/29/20   Gavin Pound, CNM  ?acetaminophen (TYLENOL) 500 MG tablet Take 2 tablets (1,000 mg total) by mouth every 6 (six) hours as needed for fever or headache. 08/17/20   Christin Fudge, CNM  ?Blood Glucose Monitoring Suppl (ACCU-CHEK GUIDE) w/Device KIT 1 kit by Does not apply route in the morning, at noon, in the evening, and at bedtime. 09/29/20   Gavin Pound, CNM  ?coconut oil OIL Apply 1 application topically as needed. 11/26/20   Maness, Arnette Norris, MD  ?glucose blood (ACCU-CHEK GUIDE) test strip Use as instructed 09/29/20   Gavin Pound, CNM  ?ibuprofen (ADVIL) 600 MG tablet Take 1 tablet (600 mg total) by mouth every 6 (six) hours. 11/26/20   Maness, Arnette Norris, MD  ?simethicone (MYLICON) 80 MG chewable tablet Chew 1 tablet (80 mg total) by mouth as needed for flatulence. 11/26/20   Delora Fuel, MD  ?    ? ?Allergies    ?Patient has no known allergies.   ? ?Review of Systems   ?Review of Systems  ?All other systems reviewed and are negative. ? ?Physical Exam ?Updated Vital Signs ?BP 132/61 (BP Location: Right Arm)   Pulse 65   Temp 98.7 ?F (37.1 ?C) (Oral)   Resp 16   SpO2 98%  ?Physical Exam ?Vitals and nursing note reviewed.  ?Constitutional:   ?   General: She is not in acute distress. ?   Appearance: She is well-developed. She is obese.  ?HENT:  ?   Head: Atraumatic.  ?Eyes:  ?   Conjunctiva/sclera: Conjunctivae normal.  ?Pulmonary:  ?   Effort: Pulmonary effort is normal.  ?Musculoskeletal:  ?   Cervical back: Neck supple.  ?Skin: ?   Findings: No rash.  ?   Comments: Patient has several small subcutaneous nontender nodule noted to have a right axillary fold, mid abdominal wall, right proximal buttock without rectal involvement, and bilateral thigh measuring less than 1 cm without surrounding skin erythema.  ?Neurological:  ?   Mental Status: She is alert.  ?Psychiatric:     ?   Mood and Affect: Mood normal.  ? ? ?ED Results / Procedures / Treatments   ?Labs ?(all labs ordered are listed, but only abnormal results are displayed) ?Labs Reviewed  ?POC URINE PREG, ED  ? ? ?EKG ?None ? ?Radiology ?No results found. ? ?Procedures ?Procedures  ? ? ?Medications Ordered in  ED ?Medications - No data to display ? ?ED Course/ Medical Decision Making/ A&P ?  ?                        ?Medical Decision Making ? ?BP 132/61 (BP Location: Right Arm)   Pulse 65   Temp 98.7 ?F (37.1 ?C) (Oral)   Resp 16   SpO2 98%  ? ?11:35 AM ?Patient with several small subcutaneous painful nodules that appears in her right axillary fold, abdominal wall, right medial buttock, and bilateral thigh suspicious of abscess not amenable for incision and drainage.  I suspect potential hidradenitis suppurativa.  Will prescribe antibiotic Treatment.  Doubt allergic reaction ? ?Pregnancy test is negative, patient can take doxycycline as treatment  of hidradenitis suppurativa.  Return precaution given. ? ? ? ? ? ? ? ?Final Clinical Impression(s) / ED Diagnoses ?Final diagnoses:  ?Suppurative hidradenitis  ? ? ?Rx / DC Orders ?ED Discharge Orders   ? ?      Ordered  ?  doxycycline (VIBRAMYCIN) 100 MG capsule  2 times daily       ? Pending  ? ?  ?  ? ?  ? ? ?  ?Domenic Moras, PA-C ?10/14/21 1217 ? ?  ?Hayden Rasmussen, MD ?10/15/21 [phone] ? ?

## 2021-11-16 ENCOUNTER — Encounter: Payer: Self-pay | Admitting: Obstetrics and Gynecology

## 2021-11-19 ENCOUNTER — Encounter: Payer: Self-pay | Admitting: Family Medicine

## 2021-11-19 ENCOUNTER — Ambulatory Visit (INDEPENDENT_AMBULATORY_CARE_PROVIDER_SITE_OTHER): Payer: Medicaid Other | Admitting: Family Medicine

## 2021-11-19 ENCOUNTER — Other Ambulatory Visit (HOSPITAL_COMMUNITY)
Admission: RE | Admit: 2021-11-19 | Discharge: 2021-11-19 | Disposition: A | Discharge status: Home / Self Care | Payer: Medicaid Other | Source: Ambulatory Visit | Attending: Family Medicine | Admitting: Family Medicine

## 2021-11-19 VITALS — BP 116/77 | HR 71 | Wt 250.6 lb

## 2021-11-19 DIAGNOSIS — Z113 Encounter for screening for infections with a predominantly sexual mode of transmission: Secondary | ICD-10-CM

## 2021-11-19 DIAGNOSIS — Z30016 Encounter for initial prescription of transdermal patch hormonal contraceptive device: Secondary | ICD-10-CM

## 2021-11-19 DIAGNOSIS — Z3046 Encounter for surveillance of implantable subdermal contraceptive: Secondary | ICD-10-CM | POA: Diagnosis not present

## 2021-11-19 MED ORDER — NORELGESTROMIN-ETH ESTRADIOL 150-35 MCG/24HR TD PTWK: 1.0000 | MEDICATED_PATCH | TRANSDERMAL | 12 refills | Status: AC

## 2021-11-19 NOTE — Progress Notes (Signed)
? ? ? ?  GYNECOLOGY OFFICE PROCEDURE NOTE ? ?Felicia Harmon is a 21 y.o. [phone] here for Nexplanon removal.  Last pap smear was on n/a ? ?Nexplanon removal Procedure ?Patient identified, informed consent performed, consent signed.   Appropriate time out taken. Nexplanon site identified.  Area prepped in usual sterile fashon. One ml of 1% lidocaine was used to anesthetize the area at the distal end of the implant. A small stab incision was made right beside the implant on the distal portion.  The Nexplanon rod was grasped using hemostats and removed without difficulty.  There was minimal blood loss. There were no complications.  3 ml of 1% lidocaine was injected around the incision for post-procedure analgesia.  Steri-strips were applied over the small incision.  A pressure bandage was applied to reduce any bruising.  The patient tolerated the procedure well and was given post procedure instructions.  Patient is planning to use patch for contraception. ? ?Venora Maples, MD/MPH ?Attending Family Medicine Physician, Faculty Practice ?[facility] for [employer], [facility] ? ?

## 2021-11-19 NOTE — Progress Notes (Signed)
Declined [facility] services at this time, pt aware if needed can call office to schedule. ? ?Wynona Canes, CMA ? ?

## 2021-11-19 NOTE — Addendum Note (Signed)
Addended by: Aviva Signs on: 11/19/2021 09:37 AM ? ? Modules accepted: Orders ? ?

## 2021-11-22 LAB — CERVICOVAGINAL ANCILLARY ONLY
Bacterial Vaginitis (gardnerella): POSITIVE — AB
Candida Glabrata: NEGATIVE
Candida Vaginitis: NEGATIVE
Chlamydia: NEGATIVE
Comment: NEGATIVE
Comment: NEGATIVE
Comment: NEGATIVE
Comment: NEGATIVE
Comment: NEGATIVE
Comment: NORMAL
Neisseria Gonorrhea: NEGATIVE
Trichomonas: NEGATIVE

## 2021-11-22 MED ORDER — METRONIDAZOLE 0.75 % VA GEL: 1.0000 | Freq: Every day | VAGINAL | 0 refills | Status: AC

## 2021-11-22 NOTE — Addendum Note (Signed)
Addended by: Clayton Lefort on: 11/22/2021 02:32 PM ? ? Modules accepted: Orders ? ?

## 2021-11-26 ENCOUNTER — Emergency Department (HOSPITAL_COMMUNITY)
Admission: EM | Admit: 2021-11-26 | Discharge: 2021-11-26 | Disposition: A | Discharge status: Home / Self Care | Payer: Medicaid Other | Attending: Emergency Medicine | Admitting: Emergency Medicine

## 2021-11-26 ENCOUNTER — Other Ambulatory Visit: Payer: Self-pay

## 2021-11-26 ENCOUNTER — Encounter (HOSPITAL_COMMUNITY): Payer: Self-pay

## 2021-11-26 DIAGNOSIS — R1010 Upper abdominal pain, unspecified: Secondary | ICD-10-CM | POA: Diagnosis present

## 2021-11-26 DIAGNOSIS — R197 Diarrhea, unspecified: Secondary | ICD-10-CM | POA: Diagnosis not present

## 2021-11-26 DIAGNOSIS — R519 Headache, unspecified: Secondary | ICD-10-CM | POA: Insufficient documentation

## 2021-11-26 DIAGNOSIS — R6883 Chills (without fever): Secondary | ICD-10-CM | POA: Diagnosis not present

## 2021-11-26 DIAGNOSIS — R112 Nausea with vomiting, unspecified: Secondary | ICD-10-CM | POA: Insufficient documentation

## 2021-11-26 DIAGNOSIS — E876 Hypokalemia: Secondary | ICD-10-CM | POA: Diagnosis not present

## 2021-11-26 LAB — CBC WITH DIFFERENTIAL/PLATELET
Abs Immature Granulocytes: 0 10*3/uL (ref 0.00–0.07)
Basophils Absolute: 0 10*3/uL (ref 0.0–0.1)
Basophils Relative: 1 %
Eosinophils Absolute: 0 10*3/uL (ref 0.0–0.5)
Eosinophils Relative: 1 %
HCT: 36.9 % (ref 36.0–46.0)
Hemoglobin: 11.4 g/dL — ABNORMAL LOW (ref 12.0–15.0)
Immature Granulocytes: 0 %
Lymphocytes Relative: 48 %
Lymphs Abs: 1.6 10*3/uL (ref 0.7–4.0)
MCH: 25.7 pg — ABNORMAL LOW (ref 26.0–34.0)
MCHC: 30.9 g/dL (ref 30.0–36.0)
MCV: 83.3 fL (ref 80.0–100.0)
Monocytes Absolute: 0.4 10*3/uL (ref 0.1–1.0)
Monocytes Relative: 12 %
Neutro Abs: 1.3 10*3/uL — ABNORMAL LOW (ref 1.7–7.7)
Neutrophils Relative %: 38 %
Platelets: 274 10*3/uL (ref 150–400)
RBC: 4.43 MIL/uL (ref 3.87–5.11)
RDW: 14.5 % (ref 11.5–15.5)
WBC: 3.4 10*3/uL — ABNORMAL LOW (ref 4.0–10.5)
nRBC: 0 % (ref 0.0–0.2)

## 2021-11-26 LAB — I-STAT BETA HCG BLOOD, ED (MC, WL, AP ONLY): I-stat hCG, quantitative: <5 m[IU]/mL (ref <5)

## 2021-11-26 LAB — COMPREHENSIVE METABOLIC PANEL
ALT: 14 U/L (ref 0–44)
AST: 19 U/L (ref 15–41)
Albumin: 3.4 g/dL — ABNORMAL LOW (ref 3.5–5.0)
Alkaline Phosphatase: 66 U/L (ref 38–126)
Anion gap: 8 (ref 5–15)
BUN: 5 mg/dL — ABNORMAL LOW (ref 6–20)
CO2: 24 mmol/L (ref 22–32)
Calcium: 8.6 mg/dL — ABNORMAL LOW (ref 8.9–10.3)
Chloride: 108 mmol/L (ref 98–111)
Creatinine, Ser: 0.74 mg/dL (ref 0.44–1.00)
GFR, Estimated: >60 mL/min (ref >60)
Glucose, Bld: 100 mg/dL — ABNORMAL HIGH (ref 70–99)
Potassium: 3.3 mmol/L — ABNORMAL LOW (ref 3.5–5.1)
Sodium: 140 mmol/L (ref 135–145)
Total Bilirubin: 0.5 mg/dL (ref 0.3–1.2)
Total Protein: 6.8 g/dL (ref 6.5–8.1)

## 2021-11-26 LAB — LIPASE, BLOOD: Lipase: 33 U/L (ref 11–51)

## 2021-11-26 NOTE — Discharge Instructions (Addendum)
You have symptoms likely due to a viral stomach bug.  It will improve over time.  You may take over-the-counter Tylenol as needed for headache, eat bland food, drink plenty of fluid, return if your symptoms worsen or if you have any other concern. ?

## 2021-11-26 NOTE — ED Provider Notes (Signed)
?[facility] ?Provider Note ? ? ?CSN: [id number] ?Arrival date & time: 11/26/21  0946 ? ?  ? ?History ? ?Chief Complaint  ?Patient presents with  ? Abdominal Pain  ? ? ?Felicia Harmon is a 21 y.o. female. ? ?The history is provided by the patient. No language interpreter was used.  ?Abdominal Pain ? ?21 year old female with significant history of gestational diabetes presenting with complaints of abdominal pain.  Patient report having recurrent upper abdominal discomfort ongoing for nearly 5 days.  Symptoms started shortly after she ate out at a [employer].  She endorsed crampy sensation to her upper abdomen, with some mild nausea but no vomiting.  She also endorsed having some loose stools.  Her symptom is waxing waning mild at this time.  She also endorsed a headache and some chills.  She does not complain of any runny nose sneezing coughing sore throat abnormal taste in her mouth chest pain or shortness of breath.  She denies any dysuria hematuria vaginal bleeding or vaginal discharge.  No blood in the stool no bloody vomitus.  She tries taking Tylenol at home with some improvement.  Patient denies history of alcohol abuse ? ?Home Medications ?Prior to Admission medications   ?Medication Sig Start Date End Date Taking? Authorizing Provider  ?acetaminophen (TYLENOL) 500 MG tablet Take 2 tablets (1,000 mg total) by mouth every 6 (six) hours as needed for fever or headache. 08/17/20  Yes Cresenzo-Dishmon, Joaquim Lai, CNM  ?norelgestromin-ethinyl estradiol Marilu Favre) 150-35 MCG/24HR transdermal patch Place 1 patch onto the skin once a week. ?Patient taking differently: Place 1 patch onto the skin every Friday. 11/19/21  Yes Clarnce Flock, MD  ?Accu-Chek Softclix Lancets lancets 1 each by Other route 4 (four) times daily. Use as instructed 09/29/20   Gavin Pound, CNM  ?Blood Glucose Monitoring Suppl (ACCU-CHEK GUIDE) w/Device KIT 1 kit by Does not apply route in the  morning, at noon, in the evening, and at bedtime. 09/29/20   Gavin Pound, CNM  ?glucose blood (ACCU-CHEK GUIDE) test strip Use as instructed 09/29/20   Gavin Pound, CNM  ?metroNIDAZOLE (METROGEL VAGINAL) 0.75 % vaginal gel Place 1 Applicatorful vaginally at bedtime for 5 days. ?Patient not taking: Reported on 11/26/2021 11/22/21 11/27/21  Clarnce Flock, MD  ?   ? ?Allergies    ?Patient has no known allergies.   ? ?Review of Systems   ?Review of Systems  ?Gastrointestinal:  Positive for abdominal pain.  ?All other systems reviewed and are negative. ? ?Physical Exam ?Updated Vital Signs ?BP 128/72   Pulse 79   Temp 98.1 ?F (36.7 ?C) (Oral)   Resp 14   Ht _0  (1.727 m)   Wt 110.2 kg   SpO2 97%   BMI 36.95 kg/m?  ?Physical Exam ?Vitals and nursing note reviewed.  ?Constitutional:   ?   General: She is not in acute distress. ?   Appearance: She is well-developed.  ?HENT:  ?   Head: Atraumatic.  ?Eyes:  ?   Conjunctiva/sclera: Conjunctivae normal.  ?Cardiovascular:  ?   Rate and Rhythm: Normal rate and regular rhythm.  ?Pulmonary:  ?   Effort: Pulmonary effort is normal.  ?Abdominal:  ?   General: Abdomen is flat. Bowel sounds are normal.  ?   Palpations: Abdomen is soft.  ?   Tenderness: There is no abdominal tenderness. Negative signs include Murphy's sign and McBurney's sign.  ?Musculoskeletal:  ?   Cervical back: Neck supple.  ?Skin: ?  Findings: No rash.  ?Neurological:  ?   Mental Status: She is alert.  ?Psychiatric:     ?   Mood and Affect: Mood normal.  ? ? ?ED Results / Procedures / Treatments   ?Labs ?(all labs ordered are listed, but only abnormal results are displayed) ?Labs Reviewed  ?CBC WITH DIFFERENTIAL/PLATELET - Abnormal; Notable for the following components:  ?    Result Value  ? WBC 3.4 (*)   ? Hemoglobin 11.4 (*)   ? MCH 25.7 (*)   ? Neutro Abs 1.3 (*)   ? All other components within normal limits  ?COMPREHENSIVE METABOLIC PANEL - Abnormal; Notable for the following components:  ?  Potassium 3.3 (*)   ? Glucose, Bld 100 (*)   ? BUN 5 (*)   ? Calcium 8.6 (*)   ? Albumin 3.4 (*)   ? All other components within normal limits  ?LIPASE, BLOOD  ?URINALYSIS, ROUTINE W REFLEX MICROSCOPIC  ?I-STAT BETA HCG BLOOD, ED ([facility], [facility], [facility] ONLY)  ? ? ?EKG ?None ? ?Radiology ?No results found. ? ?Procedures ?Procedures  ? ? ?Medications Ordered in ED ?Medications - No data to display ? ?ED Course/ Medical Decision Making/ A&P ?  ?                        ?Medical Decision Making ?Amount and/or Complexity of Data Reviewed ?Labs: ordered. ? ? ?BP 128/72   Pulse 79   Temp 98.1 ?F (36.7 ?C) (Oral)   Resp 14   Ht _0  (1.727 m)   Wt 110.2 kg   SpO2 97%   BMI 36.95 kg/m?  ? ?10:40 AM ?This is a 21 year old female presenting with complaints of upper abdominal discomfort for the past 5 days with some associated nausea and some loose stools.  Symptom has been waxing and waning and mild at this time.  On exam patient is well-appearing resting comfortably appears to be in no acute discomfort.  She does not have any reproducible abdominal tenderness on exam.  Negative Murphy sign no pain at McBurney's point.  She is afebrile and vital signs stable.  Will obtain screening labs.  I offered Tylenol but patient declined. ? ?11:49 AM ?Labs obtained and independently viewed interpreted by me.  Patient has a negative pregnancy test, labs are overall reassuring, mild hypokalemia with potassium of 3.3, recommend patient to eat a banana daily at home to help increase potassium level.  She has normal lipase.  At this time I do not anticipate advanced imaging is indicated.  On reassessment abdomen is soft and nontender.  I suspect her symptoms may be related to a viral GI bug given the nausea, decreased appetite, and having some loose stools along with headache.  Return precaution given.  Patient otherwise stable for discharge. ? ? ?This patient presents to the ED for concern of abd pain, this involves an extensive number of  treatment options, and is a complaint that carries with it a high risk of complications and morbidity.  The differential diagnosis includes viral gastroenteritis, gastritis, gerd, cholecystitis, pancreatitis, appendicitis, colitis, pna,  ? ?Co morbidities that complicate the patient evaluation ?gestational diabetes ?Additional history obtained: ? ?Additional history obtained from patient ?External records from outside source obtained and reviewed including notes from OBGYN on 11/19/21 when she had her Nexplanon removed.  ? ?Lab Tests: ? ?I Ordered, and personally interpreted labs.  The pertinent results include:  mild hypokalemia with K+ 3.3, no treatment given ? ? ?  Cardiac Monitoring: ? ?The patient was maintained on a cardiac monitor.  I personally viewed and interpreted the cardiac monitored which showed an underlying rhythm of: NSR ? ?Medicines ordered and prescription drug management: ? ? ?I have reviewed the patients home medicines and have made adjustments as needed ? ?Test Considered: ?abd/pelvis CT but felt with minimal abdominal tenderness CT is not indicated.  I have also considered limited abdominal US but low suspicion for cholecystitis ? ?Critical Interventions: ?none ? ? ?Problem List / ED Course: ?n/v/d ? ?Reevaluation: ? ?After the interventions noted above, I reevaluated the patient and found that they have :stayed the same ? ?Social Determinants of Health: ?hx of depression ? ?Dispostion: ? ?After consideration of the diagnostic results and the patients response to treatment, I feel that the patent would benefit from outpt f/u. ? ? ? ? ? ? ? ? ?Final Clinical Impression(s) / ED Diagnoses ?Final diagnoses:  ?Nausea vomiting and diarrhea  ? ? ?Rx / DC Orders ?ED Discharge Orders   ? ? None  ? ?  ? ? ?  ?Domenic Moras, PA-C ?11/26/21 1233 ? ?  ?Tegeler, Gwenyth Allegra, MD ?11/26/21 1504 ? ?

## 2021-11-26 NOTE — ED Triage Notes (Signed)
Pt reports cramping pain in abd. That similar to contractions. She has been taking otc meds to help w/BM. Has not had a solid BM since the beginning of the month. Took a suppository last night and had liquid stool. Also reports headache that started Tuesday and is worse despite otc meds  ?

## 2022-04-25 IMAGING — US US MFM OB COMP +14 WKS
1 series · 13 of 28 positions shown · non-contrast
Comparison: none

[Series 1: us mfm ob comp +14 wks · 119 acquisitions, 13 frames shown]
[im 5/119]
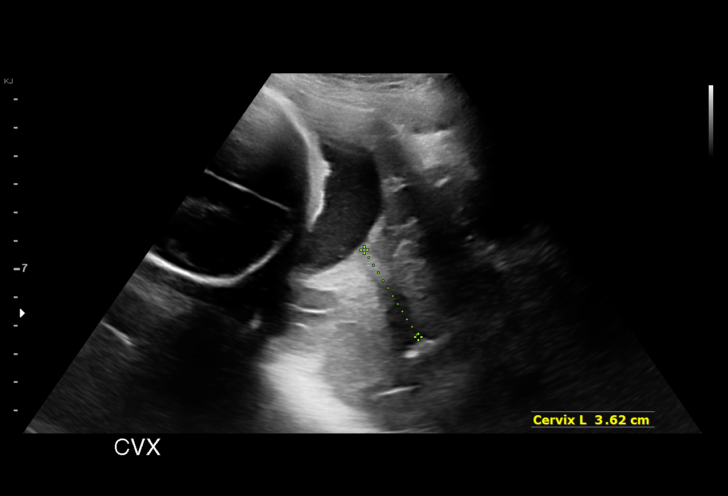
[im 14/119]
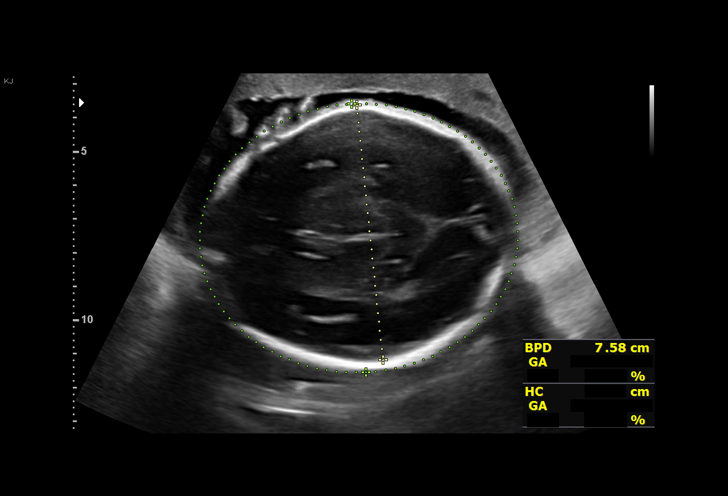
[im 22/119]
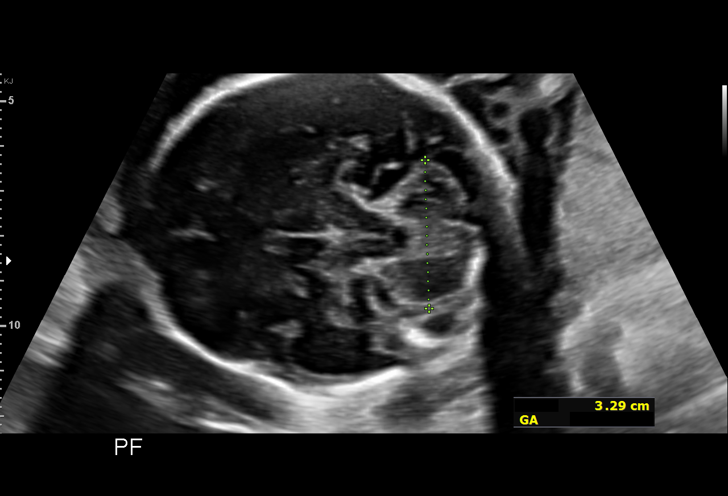
[im 31/119]
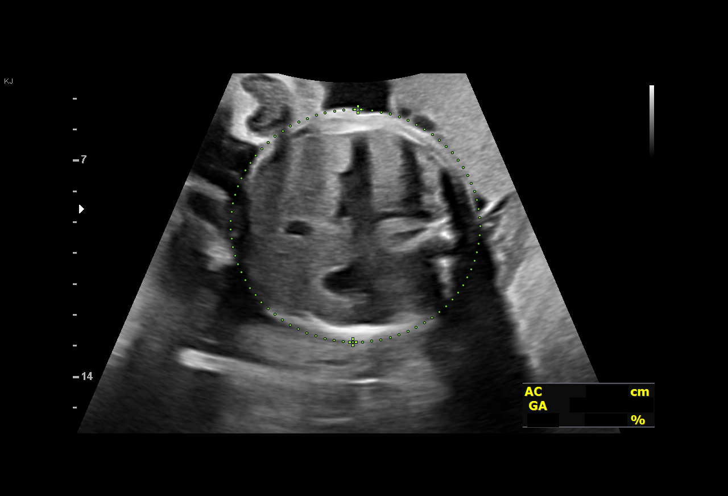
[im 40/119]
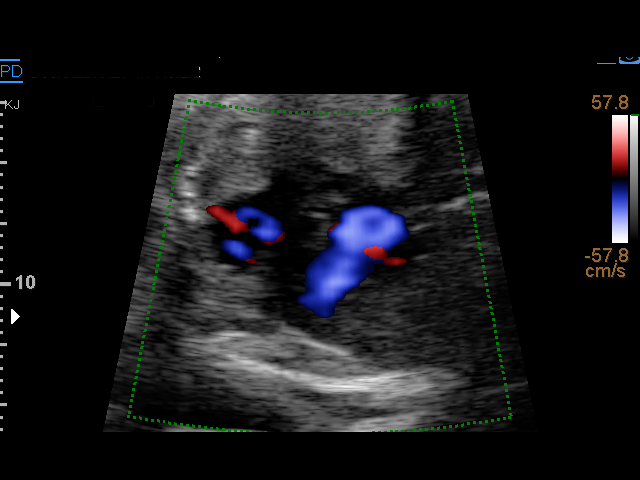
[im 49/119]
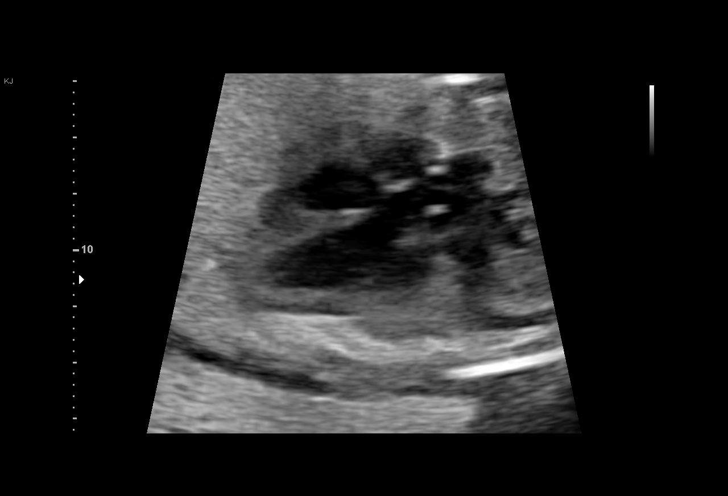
[im 62/119]
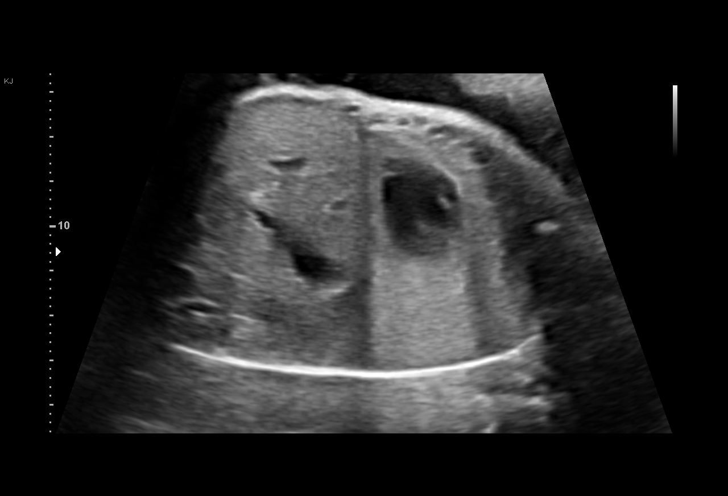
[im 70/119]
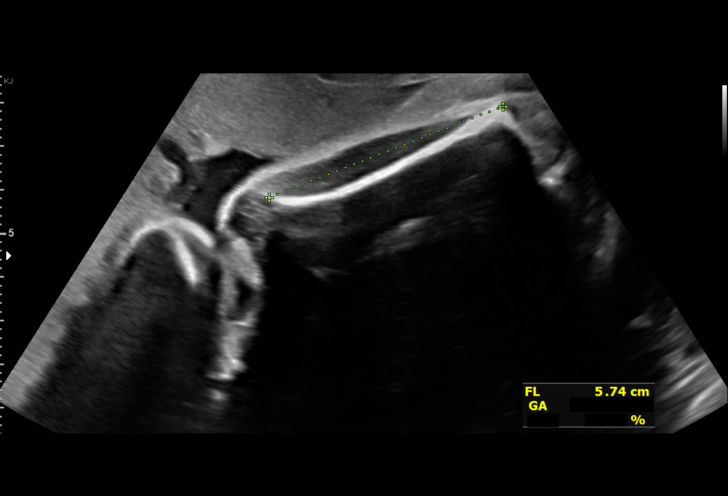
[im 79/119]
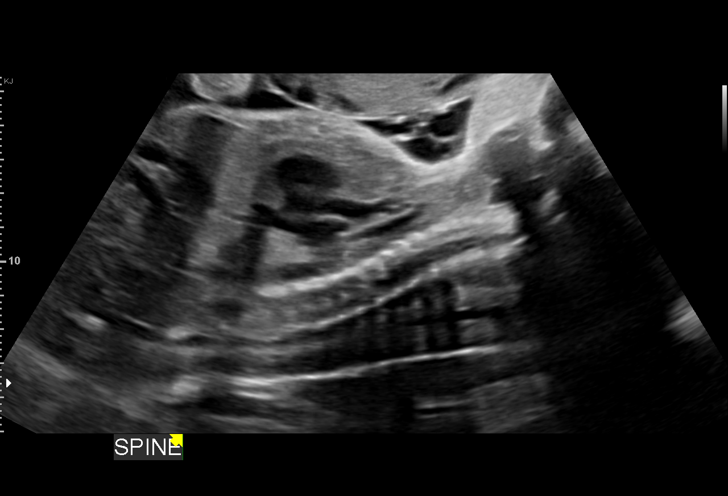
[im 88/119]
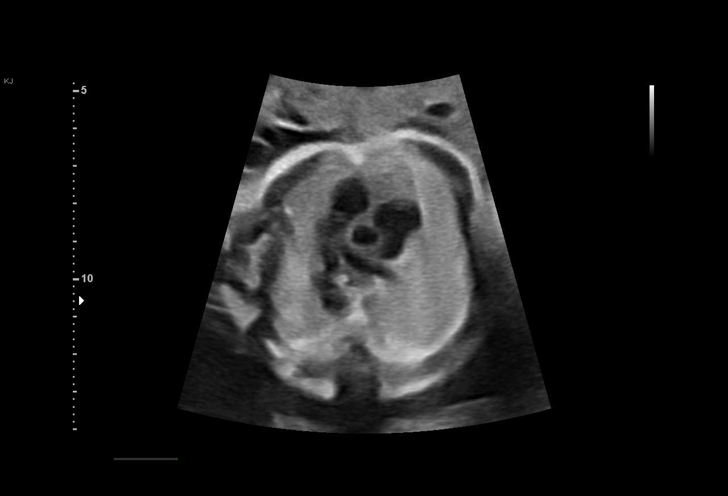
[im 97/119]
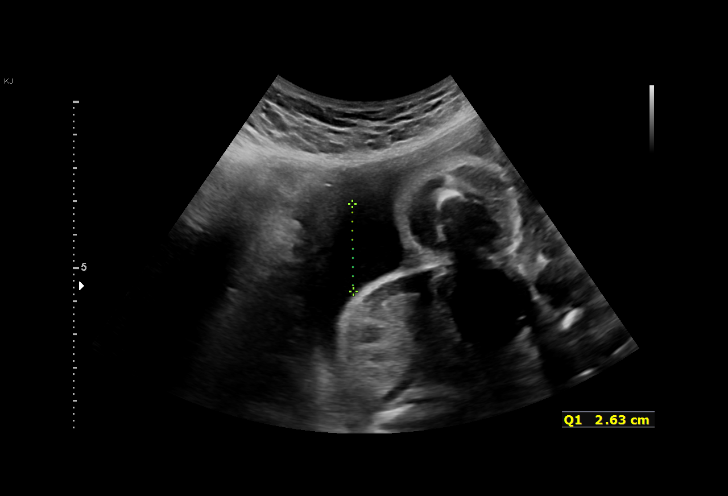
[im 105/119]
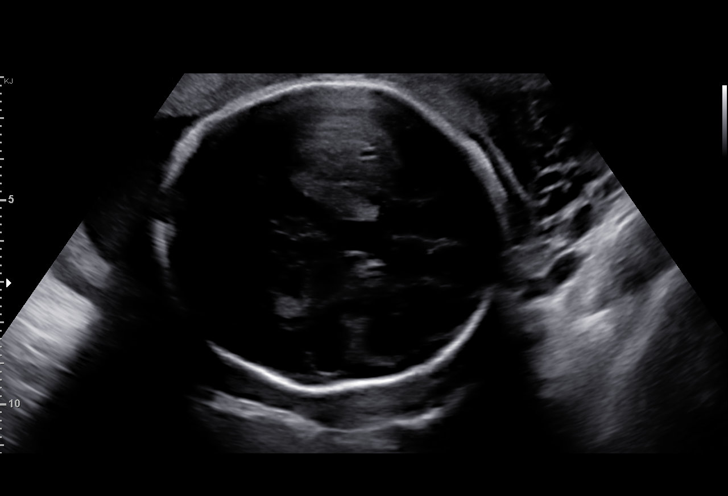
[im 114/119]
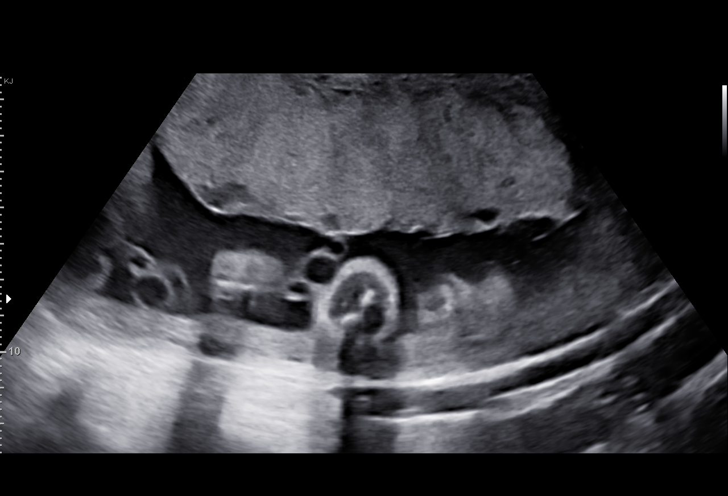

[13 of 28 positions shown; findings below may reference images not displayed]

TIGER CNM

 1  US MFM OB COMP + 14 WK                76805.01    JERALDA ELMINOR

Indications

 Late to prenatal care, third trimester
 Neg AFP
 28 weeks gestation of pregnancy
Fetal Evaluation

 Num Of Fetuses:         1
 Fetal Heart Rate(bpm):  123
 Cardiac Activity:       Observed
 Presentation:           Cephalic
 Placenta:               Anterior
 P. Cord Insertion:      Visualized, central

 Amniotic Fluid
 AFI FV:      Within normal limits

 AFI Sum(cm)     %Tile       Largest Pocket(cm)
 16.04           58

 RUQ(cm)       RLQ(cm)       LUQ(cm)        LLQ(cm)

Biometry

 BPD:      75.5  mm     G. Age:  30w 2d         89  %    CI:        78.05   %    70 - 86
                                                         FL/HC:      21.0   %    18.8 -
 HC:      270.4  mm     G. Age:  29w 3d         51  %    HC/AC:      1.11        1.05 -
 AC:      243.7  mm     G. Age:  28w 4d         49  %    FL/BPD:     75.1   %    71 - 87
 FL:       56.7  mm     G. Age:  29w 5d         74  %    FL/AC:      23.3   %    20 - 24
 HUM:      52.1  mm     G. Age:  30w 3d         86  %
 CER:      33.1  mm     G. Age:  28w 1d         44  %
 LV:        2.6  mm
 CM:        4.4  mm

 Est. FW:    8202  gm           3 lb     67  %
OB History

 Gravidity:    3         Term:   2        Prem:   0        SAB:   0
 TOP:          0       Ectopic:  0        Living: 2
Gestational Age

 LMP:           32w 4d        Date:  02/06/20                 EDD:   11/12/20
 U/S Today:     29w 4d                                        EDD:   12/03/20
 Best:          28w 3d     Det. By:  Early Ultrasound         EDD:   12/11/20
                                     (04/19/20)
Anatomy

 Cranium:               Appears normal         LVOT:                   Appears normal
 Cavum:                 Appears normal         Aortic Arch:            Appears normal
 Ventricles:            Appears normal         Ductal Arch:            Appears normal
 Choroid Plexus:        Appears normal         Diaphragm:              Appears normal
 Cerebellum:            Appears normal         Stomach:                Appears normal, left
                                                                       sided
 Posterior Fossa:       Appears normal         Abdomen:                Appears normal
 Nuchal Fold:           Appears normal         Abdominal Wall:         Appears nml (cord
                                                                       insert, abd wall)
 Face:                  Appears normal         Cord Vessels:           Appears normal (3
                        (orbits and profile)                           vessel cord)
 Lips:                  Appears normal         Kidneys:                Appear normal
 Palate:                Appears normal         Bladder:                Appears normal
 Thoracic:              Appears normal         Spine:                  Not well visualized
 Heart:                 Appears normal         Upper Extremities:      Appears normal
                        (4CH, axis, and
                        situs)
 RVOT:                  Appears normal         Lower Extremities:      Appears normal

 Other:  Hands not well visualized. Heels appear normal. Fetus appears to be
         female. SVC IVC appears normal. 3VV/T appears normal.
Cervix Uterus Adnexa

 Cervix
 Length:           3.62  cm.
 Normal appearance by transabdominal scan.

 Right Ovary
 Appears normal

 Left Ovary
 Appears normal
Comments

 This patient was seen for a detailed fetal anatomy scan as
 she presented late for prenatal care.  The patient reports that
 her EDC December 11, 2020 was based on a 6+ week ultrasound
 performed in the emergency department.
 She denies any significant past medical history and denies
 any problems in her current pregnancy.
 She had a cell free DNA test earlier in her pregnancy which
 indicated a low risk for trisomy 21, 18, and 13. A female fetus
 is predicted.
 The fetal biometry measurements obtained today measures
 about 8 days ahead of her dates based on the 6+ week
 ultrasound.
 The views of the fetal anatomy were limited today due to her
 advanced gestational age.
 The patient was informed that anomalies may be missed due
 to technical limitations. If the fetus is in a suboptimal position
 or maternal habitus is increased, visualization of the fetus in
 the maternal uterus may be impaired.
 A follow-up exam was scheduled in 4 weeks to confirm her
 dates.
